# Patient Record
Sex: Female | Born: 1985 | Race: White | Hispanic: No | Marital: Single | Smoking: Never smoker
Health system: Southern US, Community
[De-identification: ages and names within clinical notes are randomized; demographics above are authoritative.]

## PROBLEM LIST (undated history)

## (undated) DIAGNOSIS — F329 Major depressive disorder, single episode, unspecified: Secondary | ICD-10-CM

## (undated) DIAGNOSIS — F32A Depression, unspecified: Secondary | ICD-10-CM

## (undated) DIAGNOSIS — F419 Anxiety disorder, unspecified: Secondary | ICD-10-CM

## (undated) HISTORY — DX: Major depressive disorder, single episode, unspecified: F32.9

## (undated) HISTORY — DX: Depression, unspecified: F32.A

## (undated) HISTORY — PX: NO PAST SURGERIES: SHX2092

## (undated) HISTORY — DX: Anxiety disorder, unspecified: F41.9

---
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
## 2005-01-19 ENCOUNTER — Other Ambulatory Visit: Admission: RE | Admit: 2005-01-19 | Discharge: 2005-01-19 | Payer: Self-pay | Admitting: Internal Medicine

## 2006-10-29 ENCOUNTER — Other Ambulatory Visit: Admission: RE | Admit: 2006-10-29 | Discharge: 2006-10-29 | Payer: Self-pay | Admitting: Internal Medicine

## 2007-11-20 ENCOUNTER — Other Ambulatory Visit: Admission: RE | Admit: 2007-11-20 | Discharge: 2007-11-20 | Payer: Self-pay | Admitting: Internal Medicine

## 2008-11-22 ENCOUNTER — Ambulatory Visit: Payer: Self-pay | Admitting: Internal Medicine

## 2009-06-10 ENCOUNTER — Ambulatory Visit: Payer: Self-pay | Admitting: Internal Medicine

## 2010-04-28 ENCOUNTER — Ambulatory Visit: Payer: Self-pay | Admitting: Internal Medicine

## 2010-04-28 ENCOUNTER — Other Ambulatory Visit
Admission: RE | Admit: 2010-04-28 | Discharge: 2010-04-28 | Payer: Self-pay | Source: Home / Self Care | Admitting: Internal Medicine

## 2010-05-26 ENCOUNTER — Ambulatory Visit: Payer: Self-pay | Admitting: Internal Medicine

## 2010-08-25 ENCOUNTER — Inpatient Hospital Stay (HOSPITAL_COMMUNITY)
Admission: EM | Admit: 2010-08-25 | Discharge: 2010-08-28 | DRG: 450 | Disposition: A | Discharge status: Other Acute Inpatient Hospital | Payer: BC Managed Care – PPO | Attending: Internal Medicine | Admitting: Internal Medicine

## 2010-08-25 DIAGNOSIS — T450X4A Poisoning by antiallergic and antiemetic drugs, undetermined, initial encounter: Secondary | ICD-10-CM

## 2010-08-25 DIAGNOSIS — F3289 Other specified depressive episodes: Secondary | ICD-10-CM | POA: Diagnosis present

## 2010-08-25 DIAGNOSIS — T50992A Poisoning by other drugs, medicaments and biological substances, intentional self-harm, initial encounter: Secondary | ICD-10-CM

## 2010-08-25 DIAGNOSIS — F489 Nonpsychotic mental disorder, unspecified: Secondary | ICD-10-CM

## 2010-08-25 DIAGNOSIS — D72829 Elevated white blood cell count, unspecified: Secondary | ICD-10-CM | POA: Diagnosis present

## 2010-08-25 DIAGNOSIS — F329 Major depressive disorder, single episode, unspecified: Secondary | ICD-10-CM | POA: Diagnosis present

## 2010-08-25 LAB — RAPID URINE DRUG SCREEN, HOSP PERFORMED: Cocaine: NOT DETECTED

## 2010-08-25 LAB — BASIC METABOLIC PANEL
CO2: 20 mEq/L (ref 19–32)
Calcium: 9.6 mg/dL (ref 8.4–10.5)
GFR calc Af Amer: >60 mL/min (ref >60)
Sodium: 139 mEq/L (ref 135–145)

## 2010-08-25 LAB — POCT PREGNANCY, URINE: Preg Test, Ur: NEGATIVE

## 2010-08-25 LAB — CBC
Hemoglobin: 14.4 g/dL (ref 12.0–15.0)
MCH: 31.4 pg (ref 26.0–34.0)
MCV: 92.4 fL (ref 78.0–100.0)
RBC: 4.58 MIL/uL (ref 3.87–5.11)

## 2010-08-25 LAB — URINALYSIS, ROUTINE W REFLEX MICROSCOPIC
Ketones, ur: NEGATIVE mg/dL
Nitrite: NEGATIVE
Protein, ur: NEGATIVE mg/dL
Urobilinogen, UA: 0.2 mg/dL (ref 0.0–1.0)
pH: 7.5 (ref 5.0–8.0)

## 2010-08-25 LAB — MRSA PCR SCREENING: MRSA by PCR: NEGATIVE

## 2010-08-25 LAB — DIFFERENTIAL
Basophils Relative: 0 % (ref 0–1)
Lymphocytes Relative: 17 % (ref 12–46)
Lymphs Abs: 2 10*3/uL (ref 0.7–4.0)
Monocytes Relative: 5 % (ref 3–12)
Neutro Abs: 8.8 10*3/uL — ABNORMAL HIGH (ref 1.7–7.7)
Neutrophils Relative %: 78 % — ABNORMAL HIGH (ref 43–77)

## 2010-08-25 LAB — CK: Total CK: 70 U/L (ref 7–177)

## 2010-08-26 ENCOUNTER — Inpatient Hospital Stay (HOSPITAL_COMMUNITY): Payer: BC Managed Care – PPO

## 2010-08-26 LAB — URINALYSIS, ROUTINE W REFLEX MICROSCOPIC
Leukocytes, UA: NEGATIVE
Nitrite: POSITIVE — AB
Specific Gravity, Urine: 1.017 (ref 1.005–1.030)
pH: 6 (ref 5.0–8.0)

## 2010-08-26 LAB — URINE MICROSCOPIC-ADD ON

## 2010-08-28 ENCOUNTER — Inpatient Hospital Stay (HOSPITAL_COMMUNITY)
Admission: AD | Admit: 2010-08-28 | Discharge: 2010-09-01 | DRG: 426 | Disposition: A | Discharge status: Home / Self Care | Payer: BC Managed Care – PPO | Source: Ambulatory Visit | Attending: Psychiatry | Admitting: Psychiatry

## 2010-08-28 DIAGNOSIS — T50992A Poisoning by other drugs, medicaments and biological substances, intentional self-harm, initial encounter: Secondary | ICD-10-CM

## 2010-08-28 DIAGNOSIS — T450X4A Poisoning by antiallergic and antiemetic drugs, undetermined, initial encounter: Secondary | ICD-10-CM

## 2010-08-28 DIAGNOSIS — F329 Major depressive disorder, single episode, unspecified: Principal | ICD-10-CM

## 2010-08-28 DIAGNOSIS — Z6379 Other stressful life events affecting family and household: Secondary | ICD-10-CM

## 2010-08-28 DIAGNOSIS — F331 Major depressive disorder, recurrent, moderate: Secondary | ICD-10-CM

## 2010-08-28 DIAGNOSIS — F3289 Other specified depressive episodes: Principal | ICD-10-CM

## 2010-08-28 DIAGNOSIS — E876 Hypokalemia: Secondary | ICD-10-CM

## 2010-08-29 DIAGNOSIS — F322 Major depressive disorder, single episode, severe without psychotic features: Secondary | ICD-10-CM

## 2010-08-30 NOTE — Consult Note (Signed)
  NAME:  Lynn Schneider, Lynn Schneider             ACCOUNT NO.:  [id number]  MEDICAL RECORD NO.:  [id number]           PATIENT TYPE:  I  LOCATION:  1503                         FACILITY:  [facility]  PHYSICIAN:  Eulogio Ditch, MD DATE OF BIRTH:  May 31, 1986  DATE OF CONSULTATION: DATE OF DISCHARGE:                                CONSULTATION   HISTORY OF PRESENT ILLNESS:  25 year old white female with a long history of depression, whose depression was worsening for the last few days because of the slow work and number of things "I cannot handle." The patient does not want to discuss in detail about other stressors at this time.  The patient denied any suicide attempt in the past.  The patient took 15 to 20 tablets of Benadryl to kill herself and then called the EMS.  The patient is on therapy for a long period of time, on and off.  She has also tried Zoloft in the past but not for extensive period of time to see the results.  The patient agreed to be startedback on Zoloft.  PAST MEDICAL HISTORY:  No active medical issue.  FAMILY HISTORY:  The patient reported some members in the family has a history of depression.  SUBSTANCE ABUSE HISTORY:  The patient denies abuse of any drugs or alcohol.  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  She is single, has no kids, lives by herself.  MENTAL STATUS EXAM:  Calm, cooperative during the interview, pleasant on approach.  No psychomotor agitation or retardation noted during the interview.  Hygiene, grooming good.  Speech normal in rate, rhythm and volume.  Mood depressed.  Affect, mood congruent.  Thought process logical and goal directed.  Thought content, recent suicide attempt by overdose but currently denies suicidal ideations, not delusional. Thought perception, no audiovisual hallucinations reported, not internally preoccupied.  Cognition, alert, awake, oriented x3.  Memory, immediate, recent, remote intact.  Funds of knowledge  fair.  Attention, concentration fair.  Abstraction ability good.  Insight and judgment poor.  DIAGNOSES:  AXIS I:  Major depressive disorder, recurrent type. AXIS II:  Deferred. AXIS III:  See medical notes. AXIS IV:  Psychosocial stressors, long history of depression. AXIS V:  40.  RECOMMENDATIONS: 1. The patient agreed to be transferred to [facility] for     further stabilization and for group therapy. 2. Patient has agreed to be started on Zoloft 50 mg p.o. daily. 3. Once medically stable, the patient can be transferred to [facility].     Eulogio Ditch, MD     SA/MEDQ  D:  08/28/2010  T:  08/28/2010  Job:  [id number]  Electronically Signed by Eulogio Ditch  on 08/30/2010 04:48:53 AM
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
## 2010-09-04 NOTE — Discharge Summary (Signed)
  Lynn Schneider, Lynn Schneider             ACCOUNT NO.:  [id number]  MEDICAL RECORD NO.:  [id number]           PATIENT TYPE:  I  LOCATION:  0501                          FACILITY:  [facility]  PHYSICIAN:  Marlis Edelson, DO        DATE OF BIRTH:  [birth year]  DATE OF ADMISSION:  08/28/2010 DATE OF DISCHARGE:  09/01/2010                              DISCHARGE SUMMARY   REASON FOR ADMISSION:  This is a 25 year old single white female admitted from the critical care unit after the patient intentionally overdosed on Unisom in a suicide attempt.  The patient did note increasing depression for several weeks prior to the overdose.  She had an uncomplicated medical admission and was at [facility] for further assessment of her depression.  SIGNIFICANT LABORATORY DATA:  MRSA was negative.  Drug screen was negative.  Pregnancy test negative.  Salicylate level less than 4. Acetaminophen level was negative.  FINAL DIAGNOSES:  AXIS I:  Depressive disorder not otherwise specified. AXIS II:  Deferred. AXIS III:  Status post overdose on Unisom. AXIS IV:  Problems with her primary support group. AXIS V:  Current is 60.  SIGNIFICANT FINDINGS:  The patient  was integrated into the adult unit.  She was participating in groups. She was fully alert and pleasant but appeared to be sad.  On initial assessment denied any active suicidal thoughts, was not psychotic.  Her insight was fair to good.  She was expressing a strong desire to get help.  She began to improve having deterrence for self-harm in the future.  Was doing well on Zoloft.  We contacted the patient's sister to review suicide prevention and address any safety issues and concern.  CONDITION UPON DISCHARGE:  The patient was fully alert, had good insight, noting good support that she had, noting positive things about herself.  She was casually but neatly dressed, appropriate grooming. She had good eye contact.  Her speech was normal, clear.  Fully  alert. Affect was appropriate and she was coherent, agreeable to continue with her medications and her follow-up appointments.  She showed no signs of mania, hypomania or overt anxiety.  We felt the patient was stable for discharge.  Her discharge medication was Zoloft 50 mg daily.  Her follow-up was at [facility] on September 23, 2010 at 10 a.m., phone number [phone].  Appointment with Dr. Lenord Fellers on September 27, 2010 at 2:45.     Landry Corporal, N.P.   ______________________________ Marlis Edelson, DO    JO/MEDQ  D:  09/01/2010  T:  09/01/2010  Job:  [id number]  Electronically Signed by Limmie PatriciaP. on 09/04/2010 10:19:30 AM Electronically Signed by Marlis Edelson MD on 09/04/2010 08:10:06 PM

## 2010-09-05 NOTE — Discharge Summary (Signed)
  NAMESARINITY, DICICCO             ACCOUNT NO.:  [id number]  MEDICAL RECORD NO.:  [id number]           PATIENT TYPE:  I  LOCATION:  1503                         FACILITY:  [facility]  PHYSICIAN:  Marinda Elk, M.D.DATE OF BIRTH:  [birth year]  DATE OF ADMISSION:  08/25/2010 DATE OF DISCHARGE:                              DISCHARGE SUMMARY   PRIMARY CARE DOCTOR:  None.  DISCHARGE DIAGNOSES: 1. Suicidal attempt with Benadryl. 2. Leukocytosis probably secondary to suicidal attempt with Benadryl.  DISCHARGE MEDICATIONS:  None.  PROCEDURES:  None.  BRIEF ADMITTING HISTORY AND PHYSICAL:  This is a 25 year old with no significant past medical history.  He took 15-20 tablets of Benadryl with a suicide attempt, then called EMS, was brought here to the ED. When she was here, she was hemodynamically stable.  She was waxing and waning and confused.  So, she CCM was asked to admit and evaluate. Please refer to note from August 25, 2010, for the details from Dr. Sung Amabile.  ASSESSMENT AND PLAN: 1. Diphenhydramine overdose, suicidal attempt.  She was monitored in     the intensive care unit overnight.  She was watched in telemetry     with no events.  Her electrolytes were monitored.  She was found to     be hypokalemic, which was replaced.  She was breathing and sating     well.  By the next day, her confusion had resolved.  She was able     to communicate without any difficulties.  Psych was consulted and     evaluated her, and she is being transferred to [facility]     for further evaluation. 2. Leukocytosis that is probably secondary to diphenhydramine     overdose, suicide attempt.  UA was checked, it was negative.  She     remained afebrile.  So, no further evaluation.  VITALS ON THE DAY OF DISCHARGE:  Temperature is 98.2, heart rate of 73, blood pressure 101/68.  She was sating 95% on room air, breathing 13 times per minute.  Labs on the day of discharge showed, UA, 0-2  white blood cells, few bacteria.     Marinda Elk, M.D.    AF/MEDQ  D:  08/26/2010  T:  08/26/2010  Job:  [id number]  Electronically Signed by Lambert Keto M.D. on 09/05/2010 04:21:18 PM

## 2010-10-05 NOTE — Discharge Summary (Addendum)
  NAMEKATHRYNE, Lynn Schneider             ACCOUNT NO.:  [id number]  MEDICAL RECORD NO.:  [id number]           PATIENT TYPE:  I  LOCATION:  0501                          FACILITY:  [facility]  PHYSICIAN:  Marinda Elk, M.D.DATE OF BIRTH:  03/12/1986  DATE OF ADMISSION:  08/28/2010 DATE OF DISCHARGE:  09/01/2010                              DISCHARGE SUMMARY   ADDENDUM  Nothing has changed.  The patient has remained stable.  She is just awaiting placement.     Marinda Elk, M.D.     AF/MEDQ  D:  10/03/2010  T:  10/04/2010  Job:  [id number]  Electronically Signed by Lambert Keto M.D. on 10/05/2010 08:36:32 AM

## 2010-11-11 ENCOUNTER — Emergency Department (HOSPITAL_COMMUNITY)
Admission: EM | Admit: 2010-11-11 | Discharge: 2010-11-11 | Disposition: A | Discharge status: Home / Self Care | Payer: BC Managed Care – PPO | Attending: Emergency Medicine | Admitting: Emergency Medicine

## 2010-11-11 DIAGNOSIS — Q992 Fragile X chromosome: Secondary | ICD-10-CM | POA: Insufficient documentation

## 2010-11-11 DIAGNOSIS — F411 Generalized anxiety disorder: Secondary | ICD-10-CM | POA: Insufficient documentation

## 2010-11-11 LAB — POCT I-STAT, CHEM 8
Calcium, Ion: 1.13 mmol/L (ref 1.12–1.32)
Hemoglobin: 14.6 g/dL (ref 12.0–15.0)
Sodium: 139 mEq/L (ref 135–145)
TCO2: 23 mmol/L (ref 0–100)

## 2010-11-14 ENCOUNTER — Ambulatory Visit (INDEPENDENT_AMBULATORY_CARE_PROVIDER_SITE_OTHER): Payer: BC Managed Care – PPO | Admitting: Internal Medicine

## 2010-11-14 ENCOUNTER — Encounter: Payer: Self-pay | Admitting: Internal Medicine

## 2010-11-14 ENCOUNTER — Telehealth: Payer: Self-pay | Admitting: Internal Medicine

## 2010-11-14 VITALS — BP 118/74 | HR 76 | Temp 98.9°F | Wt 123.0 lb

## 2010-11-14 DIAGNOSIS — F411 Generalized anxiety disorder: Secondary | ICD-10-CM

## 2010-11-14 DIAGNOSIS — F419 Anxiety disorder, unspecified: Secondary | ICD-10-CM

## 2010-11-14 DIAGNOSIS — N926 Irregular menstruation, unspecified: Secondary | ICD-10-CM

## 2010-11-14 NOTE — Telephone Encounter (Signed)
Pt scheduled for OV today.  

## 2010-11-14 NOTE — Progress Notes (Signed)
  Subjective:    Patient ID: Lynn Schneider, female    DOB: 02/17/1986, 25 y.o.   MRN: [id number]  HPI  Micah Flesher to ER with anxiety on May 4,2012. Treated with Ativan. Sees counselor at [facility]. Had O>D> Feb 2012 hospitalized at [facility] briefly then went to [facility] for about a week. Takes Zoloft 50 mg but not on a regular basis. 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 Prior to that attended [employer] briefly. Having some issues with career path.  Seeing Melton Krebs every Friday for counseling. Not being followed by psychiatrist.  Jeanene Erb me last night with panic attack. Was at step brother's home. Became shaky. Was  Better by the time I returned the call. Mother called today saying she had another panic attack at 1pm at her mother's office . 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 Mother says she is spending lots of money and needs to give up her apartment and come back home to live.   Review of Systems  Psychiatric/Behavioral: Positive for dysphoric mood, decreased concentration and agitation. Negative for hallucinations, behavioral problems and confusion.       Objective:   Physical Exam  Constitutional: She is oriented to person, place, and time.  Neck: No thyromegaly present.  Cardiovascular: Normal rate, regular rhythm and normal heart sounds.   No murmur heard. Neurological: She is alert and oriented to person, place, and time. She has normal reflexes. She displays normal reflexes. No cranial nerve deficit. Coordination normal.  Skin: Skin is warm and dry.  Psychiatric: Her behavior is normal. Judgment and thought content normal.          Assessment & Plan:  Impression- Anxiety disorder               Plan- Add Klonopin 0.05mg - 1/2 to 1 p.o. Bid prn anxiety. Call counselor and obtain psychiatric evaluation

## 2010-11-15 LAB — HCG, SERUM, QUALITATIVE: Preg, Serum: NEGATIVE

## 2010-11-24 ENCOUNTER — Telehealth: Payer: Self-pay

## 2010-11-24 NOTE — Telephone Encounter (Signed)
Called  Dr Cottle's office to schedule her an appt, but office said she had to call herself . Phone no given to patient

## 2010-11-27 NOTE — Telephone Encounter (Signed)
Please call her mother, Hulen Skains and give her this information so MS. Lafayette Dragon can make the appt. Ms. Lafayette Dragon has a chart here with correct phone numbers. You should ask her for Lanecia's correct phone number.

## 2010-12-11 ENCOUNTER — Other Ambulatory Visit: Payer: BC Managed Care – PPO | Admitting: Internal Medicine

## 2010-12-12 ENCOUNTER — Ambulatory Visit (INDEPENDENT_AMBULATORY_CARE_PROVIDER_SITE_OTHER): Payer: BC Managed Care – PPO | Admitting: Internal Medicine

## 2010-12-12 ENCOUNTER — Other Ambulatory Visit (HOSPITAL_COMMUNITY)
Admission: RE | Admit: 2010-12-12 | Discharge: 2010-12-12 | Disposition: A | Discharge status: Home / Self Care | Payer: BC Managed Care – PPO | Source: Ambulatory Visit | Attending: Internal Medicine | Admitting: Internal Medicine

## 2010-12-12 ENCOUNTER — Encounter: Payer: Self-pay | Admitting: Internal Medicine

## 2010-12-12 VITALS — BP 92/68 | HR 72 | Temp 98.8°F | Ht 63.5 in | Wt 120.0 lb

## 2010-12-12 DIAGNOSIS — Z0000 Encounter for general adult medical examination without abnormal findings: Secondary | ICD-10-CM

## 2010-12-12 DIAGNOSIS — N760 Acute vaginitis: Secondary | ICD-10-CM

## 2010-12-12 DIAGNOSIS — F329 Major depressive disorder, single episode, unspecified: Secondary | ICD-10-CM

## 2010-12-12 DIAGNOSIS — Z7251 High risk heterosexual behavior: Secondary | ICD-10-CM

## 2010-12-12 DIAGNOSIS — N390 Urinary tract infection, site not specified: Secondary | ICD-10-CM

## 2010-12-12 DIAGNOSIS — R87810 Cervical high risk human papillomavirus (HPV) DNA test positive: Secondary | ICD-10-CM | POA: Insufficient documentation

## 2010-12-12 DIAGNOSIS — F32A Depression, unspecified: Secondary | ICD-10-CM | POA: Insufficient documentation

## 2010-12-12 LAB — POCT URINALYSIS DIPSTICK
Blood, UA: NEGATIVE
Nitrite, UA: NEGATIVE
Protein, UA: NEGATIVE
pH, UA: 5

## 2010-12-12 MED ORDER — NORGESTIM-ETH ESTRAD TRIPHASIC 0.18/0.215/0.25 MG-25 MCG PO TABS: 1.0000 | ORAL_TABLET | Freq: Every day | ORAL | Status: DC

## 2010-12-12 NOTE — Patient Instructions (Signed)
Increase Zoloft to 100 mg daily. Call me if you have any concerns or problems. Otherwise return in one year for physical examination. We will notify you of your lab results shortly.

## 2010-12-12 NOTE — Progress Notes (Signed)
  Subjective:    Patient ID: Lynn Schneider, female    DOB: [birth year], 25 y.o.   MRN: [id number]  HPI 24 year old white female with recent issues with depression. Was discharged from behavioral health unit February 24 after admission for reported Benadryl overdose. Has been in counseling with Lynn Schneider and has also seen Lynn Schneider. She was told to increase dose of Zoloft from 50-100 mg daily but has yet to do that. She has moved back in with her mother. 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 Currently has no steady boyfriend but is sexually active. Says she is using condoms. Says last menstrual period was about 4 weeks ago. Is supposed to be taking generic Ortho Tri-Cyclen. Feels that her mother tries to tell her what to do. She used to just go along with it but now feels the need to be her own person. Her father died when she was teary young as a result of an MI. Her mother remarried and she was adopted by her mother second husband. For a while she had a good relationship with him but he has since remarried. She feels that sometimes they do not believe her. They were disappointed that she has several tattoos. Several years ago, her mother took her to [facility] to be tested for fragile X. syndrome. She was found to be a carrier. There is a family history of fragile X. in her mothers nephew. Unfortunately, her mother blames fragile X. for many of Lynn Schneider problems. When Lynn Schneider was in grade school and high school, she took attention deficit disorder medication. She has an older sister who is married and has a Development worker, international aid. Lynn Schneider says she has a number of friends. Admits to being sexually active this past weekend. Her mother and  sister are in good health.    Review of Systems denies URI symptoms, vaginal discharge, du jour area, chest pain, shortness of breath, abdominal pain, nausea vomiting or diarrhea. She  had an argument with her mother last night and her eyelids are swollen she says from crying.     Objective:   Physical Exam eyelids swollen bilaterally. no discharge from eyes. Conjunctivae slightly injected. TMs are clear. pharynx is clear. dentition is good. neck is supple. no thyromegaly. no adenopathy. chest clear. breasts normal female without masses. cardiac exam normal without murmurs. Abdomen: no hepatosplenomegaly, masses, or tenderness. pelvic exam: normal external female genitalia. Fishy smelling vaginal discharge cream-colored. GC and Chlamydia probes were taken. Pap smear obtained. No masses on bimanual exam. Rectovaginal confirms. Extremities without deformity. Neurologic: no gross focal deficits o. brief neurological exam.        Assessment & Plan:  1-depression recommended she go up on dose of Zoloft to 100 mg a has recommended by Lynn Schneider. Continue counseling with Lynn Schneider  2-bacterial vaginosis-treat with Cleocin vaginal cream 1 applicator full in vagina each bedtime x7 days followed by a vinegar and water douche for one application only  3-history of being fragile X. carrier

## 2010-12-13 LAB — COMPREHENSIVE METABOLIC PANEL
AST: 14 U/L (ref 0–37)
Albumin: 4.5 g/dL (ref 3.5–5.2)
Alkaline Phosphatase: 47 U/L (ref 39–117)
BUN: 14 mg/dL (ref 6–23)
Creat: 0.62 mg/dL (ref 0.50–1.10)
Glucose, Bld: 74 mg/dL (ref 70–99)
Potassium: 4.2 mEq/L (ref 3.5–5.3)
Total Bilirubin: 0.4 mg/dL (ref 0.3–1.2)

## 2010-12-13 LAB — TSH: TSH: 1.584 u[IU]/mL (ref 0.350–4.500)

## 2010-12-13 LAB — LIPID PANEL
Cholesterol: 157 mg/dL (ref 0–200)
Triglycerides: 63 mg/dL (ref <150)
VLDL: 13 mg/dL (ref 0–40)

## 2010-12-13 LAB — CBC WITH DIFFERENTIAL/PLATELET
Basophils Absolute: 0 10*3/uL (ref 0.0–0.1)
Basophils Relative: 0 % (ref 0–1)
Eosinophils Absolute: 0 10*3/uL (ref 0.0–0.7)
HCT: 42.1 % (ref 36.0–46.0)
MCH: 31.3 pg (ref 26.0–34.0)
MCHC: 33.7 g/dL (ref 30.0–36.0)
Monocytes Absolute: 0.8 10*3/uL (ref 0.1–1.0)
Monocytes Relative: 7 % (ref 3–12)
Neutro Abs: 8.6 10*3/uL — ABNORMAL HIGH (ref 1.7–7.7)
RDW: 12.5 % (ref 11.5–15.5)

## 2010-12-13 LAB — HIV-1 RNA QUANT-NO REFLEX-BLD

## 2010-12-13 LAB — GC/CHLAMYDIA PROBE AMP, GENITAL: GC Probe Amp, Genital: NEGATIVE

## 2010-12-14 LAB — URINE CULTURE: Colony Count: 75000

## 2010-12-14 LAB — HIV ANTIBODY (ROUTINE TESTING W REFLEX): HIV: NONREACTIVE

## 2010-12-14 LAB — HEPATITIS C ANTIBODY: HCV Ab: NEGATIVE

## 2010-12-18 ENCOUNTER — Encounter: Payer: Self-pay | Admitting: Internal Medicine

## 2010-12-19 ENCOUNTER — Encounter: Payer: Self-pay | Admitting: Internal Medicine

## 2010-12-21 ENCOUNTER — Telehealth: Payer: Self-pay | Admitting: *Deleted

## 2010-12-21 DIAGNOSIS — R87619 Unspecified abnormal cytological findings in specimens from cervix uteri: Secondary | ICD-10-CM

## 2010-12-21 DIAGNOSIS — IMO0002 Reserved for concepts with insufficient information to code with codable children: Secondary | ICD-10-CM

## 2010-12-21 NOTE — Telephone Encounter (Signed)
Pt referred to Dr. Renaldo Fiddler for evaluation of abnormal pap smear.  Appointment scheduled Tues 6/19 at 9:45.

## 2011-01-01 ENCOUNTER — Other Ambulatory Visit: Payer: Self-pay | Admitting: Obstetrics and Gynecology

## 2011-06-28 ENCOUNTER — Ambulatory Visit (INDEPENDENT_AMBULATORY_CARE_PROVIDER_SITE_OTHER): Payer: BC Managed Care – PPO | Admitting: Internal Medicine

## 2011-06-28 ENCOUNTER — Encounter: Payer: Self-pay | Admitting: Internal Medicine

## 2011-06-28 DIAGNOSIS — J111 Influenza due to unidentified influenza virus with other respiratory manifestations: Secondary | ICD-10-CM

## 2011-06-28 NOTE — Progress Notes (Signed)
  Subjective:    Patient ID: Lynn Schneider, female    DOB: [birth year], 25 y.o.   MRN: [id number]  HPI  Patient  in today with fever, myalgias, chills, malaise, fatigue, nausea, anorexia. Coughing. Started out with scratchy throat and progressed rapidly into myalgias. Did not take influenza immunization this season. Vomited several times last night. No diarrhea.    Review of Systems     Objective:   Physical Exam HEENT exam: TMs are pink and full bilaterally; pharynx very slightly injected; neck is supple without adenopathy; chest clear. Patient looks a bit pale.        Assessment & Plan:  Influenza  Plan: Tamiflu 75 mg twice daily for 5 days; Tessalon Perles 100 mg (#60) 2 by mouth 3 times a day when necessary cough; Zofran 8 mg tablets (#30) 1 by mouth every 6-8 hours when necessary nausea. Call if not better in one week or sooner if  worse. Take Tylenol for fever.

## 2011-06-28 NOTE — Patient Instructions (Signed)
Take Tylenol for fever. Taking Zofran for nausea. Start Tamiflu immediately 75 mg twice daily for 5 days. Take Tessalon Perles as needed for cough. Call if not better in one week or sooner if worse.

## 2012-04-24 ENCOUNTER — Other Ambulatory Visit: Payer: BC Managed Care – PPO | Admitting: Internal Medicine

## 2012-04-24 DIAGNOSIS — F32A Depression, unspecified: Secondary | ICD-10-CM

## 2012-04-24 DIAGNOSIS — F329 Major depressive disorder, single episode, unspecified: Secondary | ICD-10-CM

## 2012-04-24 DIAGNOSIS — Z0000 Encounter for general adult medical examination without abnormal findings: Secondary | ICD-10-CM

## 2012-04-24 LAB — CBC WITH DIFFERENTIAL/PLATELET
Basophils Absolute: 0 10*3/uL (ref 0.0–0.1)
Basophils Relative: 1 % (ref 0–1)
HCT: 42.2 % (ref 36.0–46.0)
Lymphocytes Relative: 31 % (ref 12–46)
MCHC: 35.3 g/dL (ref 30.0–36.0)
Monocytes Absolute: 0.5 10*3/uL (ref 0.1–1.0)
Neutro Abs: 4.5 10*3/uL (ref 1.7–7.7)
Platelets: 278 10*3/uL (ref 150–400)
RDW: 12.9 % (ref 11.5–15.5)
WBC: 7.4 10*3/uL (ref 4.0–10.5)

## 2012-04-24 LAB — LIPID PANEL
Cholesterol: 192 mg/dL (ref 0–200)
LDL Cholesterol: 129 mg/dL — ABNORMAL HIGH (ref 0–99)
Total CHOL/HDL Ratio: 4.5 Ratio
Triglycerides: 98 mg/dL (ref <150)
VLDL: 20 mg/dL (ref 0–40)

## 2012-04-24 LAB — COMPREHENSIVE METABOLIC PANEL
ALT: 9 U/L (ref 0–35)
AST: 12 U/L (ref 0–37)
Albumin: 4.1 g/dL (ref 3.5–5.2)
Alkaline Phosphatase: 50 U/L (ref 39–117)
BUN: 14 mg/dL (ref 6–23)
Creat: 0.65 mg/dL (ref 0.50–1.10)
Potassium: 4.2 mEq/L (ref 3.5–5.3)

## 2012-04-25 ENCOUNTER — Ambulatory Visit (INDEPENDENT_AMBULATORY_CARE_PROVIDER_SITE_OTHER): Payer: BC Managed Care – PPO | Admitting: Internal Medicine

## 2012-04-25 ENCOUNTER — Encounter: Payer: Self-pay | Admitting: Internal Medicine

## 2012-04-25 VITALS — BP 100/66 | HR 72 | Temp 99.0°F | Ht 63.5 in | Wt 133.0 lb

## 2012-04-25 DIAGNOSIS — Z0000 Encounter for general adult medical examination without abnormal findings: Secondary | ICD-10-CM

## 2012-04-25 DIAGNOSIS — Z8659 Personal history of other mental and behavioral disorders: Secondary | ICD-10-CM

## 2012-04-25 LAB — POCT URINALYSIS DIPSTICK
Bilirubin, UA: NEGATIVE
Blood, UA: NEGATIVE
Ketones, UA: NEGATIVE
Protein, UA: NEGATIVE
pH, UA: 7

## 2012-04-25 NOTE — Progress Notes (Signed)
Subjective:    Patient ID: Lynn Schneider, female    DOB: May 22, 1986, 26 y.o.   MRN: [id number]  HPI 26 year old white female with history of depression. She was hospitalized February 2012 for Unisom overdose and was transferred to [facility].  [facility] when she was 26 years old. Had previously been diagnosed by Dr. Jane Canary with attention deficit disorder. Was on Ritalin for a number of years. Subsequently was on Concerta. Currently not on any attention deficit medication through this office. Her biological father died when she was approximately 26 years old of an acute heart attack. He was 26 years old and previously had been incarcerated for selling drugs for some 10 years. Her mother had remarried and her new husband adopted [place] but not her older sister. Mother is a self-employed Network engineer. Mother has since divorced and is single again. Maternal female cousin has fragile X syndrome. A maternal uncle has schizophrenia diagnosed at age 26. Mother is in good health.  Received meningococcal vaccine August 2003 before going to preparatory school in [place]. 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 Tetanus immunization April 28, 2010.  Apparently Yorley has been tested at [facility] and has an X  chromosome  for [employer]. We do not have reports from [facility]. She is always struggled in school. She attended [employer] for short time and did not do well. She lived on campus. She is now  living with her mother. She says she wants to move to [facility]. 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 Thinks she might would like to do skin care in [place].  Recently saw Dr. Zelphia Cairo for GYN exam. Says she's on oral contraceptives. Denies being sexually active at present time.         Review of Systems  Constitutional: Negative.   HENT:  Negative.   Eyes: Negative.   Respiratory: Negative.   Genitourinary: Negative.   Musculoskeletal: Negative.   Neurological: Negative.   Hematological: Negative.   Psychiatric/Behavioral:       History of depression. History of attention deficit       Objective:   Physical Exam  Vitals reviewed. Constitutional: She is oriented to person, place, and time. She appears well-developed and well-nourished. No distress.  HENT:  Head: Normocephalic and atraumatic.  Right Ear: External ear normal.  Left Ear: External ear normal.  Mouth/Throat: Oropharynx is clear and moist. No oropharyngeal exudate.  Eyes: Conjunctivae normal and EOM are normal. Pupils are equal, round, and reactive to light. Right eye exhibits no discharge. Left eye exhibits no discharge. No scleral icterus.  Neck: Neck supple. No JVD present. No thyromegaly present.  Cardiovascular: Normal rate, regular rhythm, normal heart sounds and intact distal pulses.   No murmur heard. Pulmonary/Chest: Effort normal and breath sounds normal. No respiratory distress. She has no wheezes. She has no rales. She exhibits no tenderness.       Breasts normal female without masses  Abdominal: Soft. Bowel sounds are normal. She exhibits no distension and no mass. There is no tenderness. There is no rebound and no guarding.  Genitourinary:       Deferred to GYN  Musculoskeletal: She exhibits no edema.  Lymphadenopathy:    She has no cervical adenopathy.  Neurological: She is alert and oriented to person, place, and time. She has normal reflexes. She displays normal reflexes.  No cranial nerve deficit. Coordination normal.  Skin: Skin is warm and dry. No rash noted. She is not diaphoretic.       Multiple tattoos. One tattoo on each wrist-single words in Spanish.  Psychiatric: She has a normal mood and affect. Her behavior is normal. Judgment and thought content normal.          Assessment & Plan:  Normal health maintenance  exam  History of depression  History of X chromosome for fragile X syndrome  History of attention deficit disorder  Plan: Return in one year or as needed. Get records from GYN physician. We need to determine if she has had Gardasil vaccine. I do not have any records of having given her Gardasil vaccine through this office.

## 2012-04-26 ENCOUNTER — Encounter: Payer: Self-pay | Admitting: Internal Medicine

## 2012-04-26 NOTE — Patient Instructions (Addendum)
Continue same medications and return in one year. 

## 2012-11-06 ENCOUNTER — Encounter: Payer: Self-pay | Admitting: Internal Medicine

## 2012-11-06 ENCOUNTER — Ambulatory Visit (INDEPENDENT_AMBULATORY_CARE_PROVIDER_SITE_OTHER): Payer: BC Managed Care – PPO | Admitting: Internal Medicine

## 2012-11-06 VITALS — BP 92/66 | Temp 100.7°F | Wt 136.5 lb

## 2012-11-06 DIAGNOSIS — R509 Fever, unspecified: Secondary | ICD-10-CM

## 2012-11-06 DIAGNOSIS — N10 Acute tubulo-interstitial nephritis: Secondary | ICD-10-CM

## 2012-11-06 DIAGNOSIS — N390 Urinary tract infection, site not specified: Secondary | ICD-10-CM

## 2012-11-06 DIAGNOSIS — Z148 Genetic carrier of other disease: Secondary | ICD-10-CM | POA: Insufficient documentation

## 2012-11-06 LAB — COMPREHENSIVE METABOLIC PANEL
ALT: 15 U/L (ref 0–35)
AST: 13 U/L (ref 0–37)
CO2: 24 mEq/L (ref 19–32)
Chloride: 104 mEq/L (ref 96–112)
Sodium: 138 mEq/L (ref 135–145)
Total Bilirubin: 0.5 mg/dL (ref 0.3–1.2)
Total Protein: 7 g/dL (ref 6.0–8.3)

## 2012-11-06 LAB — CBC WITH DIFFERENTIAL/PLATELET
HCT: 41.3 % (ref 36.0–46.0)
Lymphs Abs: 2.3 10*3/uL (ref 0.7–4.0)
MCH: 30.5 pg (ref 26.0–34.0)
MCV: 94 fL (ref 78.0–100.0)
Monocytes Absolute: 0.9 10*3/uL (ref 0.1–1.0)
Platelets: 250 10*3/uL (ref 150–400)
RDW: 12.2 % (ref 11.5–15.5)

## 2012-11-06 MED ORDER — CEFTRIAXONE SODIUM 1 G IJ SOLR
1.0000 g | Freq: Once | INTRAMUSCULAR | Status: AC
  Administered 2012-11-06: 1 g via INTRAMUSCULAR

## 2012-11-06 NOTE — Patient Instructions (Addendum)
You have been given 1 g IM Rocephin. Blood work is pending. Take Cipro 500 mg twice daily for 10 days. Urine culture has been sent. Return in 2 weeks.

## 2012-11-06 NOTE — Progress Notes (Signed)
  Subjective:    Patient ID: Lynn Schneider, female    DOB: August 07, 1985, 27 y.o.   MRN: [id number]  HPI  Onset 2 days ago of fever and shaking chills. Has had myalgias. No frank back pain. Slight nausea. Vomited 2 days ago and yesterday but not today. Denies urinary tract infection symptoms. Last menstrual period was last week. Temperature is been up to 104 according to patient. Denies possibility of pregnancy. 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 Denies vaginal discharge. No sore throat or cough.    Review of Systems     Objective:   Physical Exam slight bilateral CVA tenderness; chest clear to auscultation; TMs are slightly full bilaterally but not red. Pharynx is clear. Neck is supple without adenopathy. Urinalysis is abnormal with LE and nitrite present. Culture sent.        Assessment & Plan:  Probable pyelonephritis  Plan: CBC with differential and C-met sent. 1 g IM Rocephin. Start Cipro 500 mg twice daily for 10 days. Return in 2 weeks.

## 2012-11-07 LAB — POCT URINALYSIS DIPSTICK
Bilirubin, UA: NEGATIVE
Glucose, UA: NEGATIVE
Ketones, UA: NEGATIVE
Spec Grav, UA: 1.02
pH, UA: 6

## 2012-11-10 LAB — URINE CULTURE: Colony Count: >=100000

## 2012-12-18 ENCOUNTER — Telehealth: Payer: Self-pay

## 2012-12-18 ENCOUNTER — Ambulatory Visit (INDEPENDENT_AMBULATORY_CARE_PROVIDER_SITE_OTHER): Payer: BC Managed Care – PPO | Admitting: Internal Medicine

## 2012-12-18 ENCOUNTER — Encounter: Payer: Self-pay | Admitting: Internal Medicine

## 2012-12-18 VITALS — BP 96/60 | HR 88 | Temp 98.4°F | Wt 138.0 lb

## 2012-12-18 DIAGNOSIS — B999 Unspecified infectious disease: Secondary | ICD-10-CM

## 2012-12-18 MED ORDER — CEFTRIAXONE SODIUM 1 G IJ SOLR
1.0000 g | Freq: Once | INTRAMUSCULAR | Status: AC
  Administered 2012-12-18: 1 g via INTRAMUSCULAR

## 2012-12-18 NOTE — Telephone Encounter (Signed)
Called pharmacy and authorized per Dr. Lenord Fellers Levaquin 500mg ; #7; take 1 tablet daily for infection; no refills. KW

## 2013-01-05 ENCOUNTER — Encounter: Payer: Self-pay | Admitting: Internal Medicine

## 2013-01-05 NOTE — Patient Instructions (Addendum)
Take antibiotics as directed. Call if not better in 48 hours or sooner if worse

## 2013-01-05 NOTE — Progress Notes (Signed)
  Subjective:    Patient ID: Lynn Schneider, female    DOB: 1986/04/10, 27 y.o.   MRN: [id number]  HPI patient presents with erythema and tenderness base of right third finger. Denies any injury to that area.    Review of Systems     Objective:   Physical Exam erythema without swelling the 20 right third MCP and PIP joints. Full range of motion right finger       Assessment & Plan:  Suspect paronychia right finger with cellulitis  Plan: Keflex 500 mg 4 times daily for 7 days.

## 2013-04-28 ENCOUNTER — Other Ambulatory Visit: Payer: BC Managed Care – PPO | Admitting: Internal Medicine

## 2013-04-30 ENCOUNTER — Encounter: Payer: BC Managed Care – PPO | Admitting: Internal Medicine

## 2013-06-01 ENCOUNTER — Other Ambulatory Visit: Payer: BC Managed Care – PPO | Admitting: Internal Medicine

## 2013-06-02 ENCOUNTER — Encounter: Payer: BC Managed Care – PPO | Admitting: Internal Medicine

## 2013-06-25 ENCOUNTER — Other Ambulatory Visit: Payer: BC Managed Care – PPO | Admitting: Internal Medicine

## 2013-06-25 DIAGNOSIS — Z130 Encounter for screening for diseases of the blood and blood-forming organs and certain disorders involving the immune mechanism: Secondary | ICD-10-CM

## 2013-06-25 DIAGNOSIS — Z1329 Encounter for screening for other suspected endocrine disorder: Secondary | ICD-10-CM

## 2013-06-25 DIAGNOSIS — Z0000 Encounter for general adult medical examination without abnormal findings: Secondary | ICD-10-CM

## 2013-06-25 DIAGNOSIS — Z13220 Encounter for screening for lipoid disorders: Secondary | ICD-10-CM

## 2013-06-25 LAB — CBC WITH DIFFERENTIAL/PLATELET
Basophils Absolute: 0 10*3/uL (ref 0.0–0.1)
Eosinophils Absolute: 0.1 10*3/uL (ref 0.0–0.7)
Eosinophils Relative: 1 % (ref 0–5)
Lymphs Abs: 2.1 10*3/uL (ref 0.7–4.0)
MCH: 30.7 pg (ref 26.0–34.0)
MCV: 88.7 fL (ref 78.0–100.0)
Monocytes Absolute: 0.6 10*3/uL (ref 0.1–1.0)
Platelets: 276 10*3/uL (ref 150–400)
RDW: 13.4 % (ref 11.5–15.5)

## 2013-06-25 LAB — COMPREHENSIVE METABOLIC PANEL
BUN: 18 mg/dL (ref 6–23)
CO2: 27 mEq/L (ref 19–32)
Calcium: 9.2 mg/dL (ref 8.4–10.5)
Chloride: 107 mEq/L (ref 96–112)
Creat: 0.6 mg/dL (ref 0.50–1.10)
Glucose, Bld: 93 mg/dL (ref 70–99)

## 2013-06-25 LAB — LIPID PANEL
Cholesterol: 207 mg/dL — ABNORMAL HIGH (ref 0–200)
HDL: 46 mg/dL (ref >39)
Total CHOL/HDL Ratio: 4.5 Ratio
Triglycerides: 85 mg/dL (ref <150)

## 2013-06-26 ENCOUNTER — Ambulatory Visit (INDEPENDENT_AMBULATORY_CARE_PROVIDER_SITE_OTHER): Payer: BC Managed Care – PPO | Admitting: Internal Medicine

## 2013-06-26 ENCOUNTER — Other Ambulatory Visit (HOSPITAL_COMMUNITY)
Admission: RE | Admit: 2013-06-26 | Discharge: 2013-06-26 | Disposition: A | Discharge status: Home / Self Care | Payer: BC Managed Care – PPO | Source: Ambulatory Visit | Attending: Internal Medicine | Admitting: Internal Medicine

## 2013-06-26 ENCOUNTER — Encounter: Payer: Self-pay | Admitting: Internal Medicine

## 2013-06-26 VITALS — BP 100/74 | HR 68 | Temp 95.9°F | Resp 18 | Ht 63.0 in | Wt 136.0 lb

## 2013-06-26 DIAGNOSIS — Z0000 Encounter for general adult medical examination without abnormal findings: Secondary | ICD-10-CM

## 2013-06-26 DIAGNOSIS — Z01419 Encounter for gynecological examination (general) (routine) without abnormal findings: Secondary | ICD-10-CM | POA: Insufficient documentation

## 2013-06-26 DIAGNOSIS — Z113 Encounter for screening for infections with a predominantly sexual mode of transmission: Secondary | ICD-10-CM

## 2013-06-26 DIAGNOSIS — Z23 Encounter for immunization: Secondary | ICD-10-CM

## 2013-06-27 LAB — GC/CHLAMYDIA PROBE AMP: GC Probe RNA: NEGATIVE

## 2013-08-04 ENCOUNTER — Encounter: Payer: Self-pay | Admitting: Internal Medicine

## 2013-08-04 ENCOUNTER — Telehealth: Payer: Self-pay

## 2013-08-04 ENCOUNTER — Ambulatory Visit (INDEPENDENT_AMBULATORY_CARE_PROVIDER_SITE_OTHER): Payer: BC Managed Care – PPO | Admitting: Internal Medicine

## 2013-08-04 VITALS — BP 96/70 | Temp 98.6°F | Wt 135.5 lb

## 2013-08-04 DIAGNOSIS — R059 Cough, unspecified: Secondary | ICD-10-CM

## 2013-08-04 DIAGNOSIS — H6500 Acute serous otitis media, unspecified ear: Secondary | ICD-10-CM

## 2013-08-04 DIAGNOSIS — R05 Cough: Secondary | ICD-10-CM

## 2013-08-04 DIAGNOSIS — H6502 Acute serous otitis media, left ear: Secondary | ICD-10-CM

## 2013-08-04 MED ORDER — BENZONATATE 100 MG PO CAPS: 200.0000 mg | ORAL_CAPSULE | Freq: Three times a day (TID) | ORAL | Status: DC

## 2013-08-04 MED ORDER — AZITHROMYCIN 250 MG PO TABS: ORAL_TABLET | ORAL | Status: DC

## 2013-08-04 NOTE — Telephone Encounter (Signed)
States she's had a cough for about 2 weeks now. Started with a scratchy throat which is now gone. No fever, chills. Cough is non productive. No congestion or earache. Has used OTC cough preparations.

## 2013-08-04 NOTE — Telephone Encounter (Signed)
Left message on phone for patient to come in at 4:15 today.

## 2013-08-04 NOTE — Telephone Encounter (Signed)
See today

## 2013-08-05 NOTE — Patient Instructions (Addendum)
Take Zithromax Z-PAK as directed. Take Tessalon Perles as needed for cough. Call if not better in 7-10 days 

## 2013-08-05 NOTE — Progress Notes (Signed)
   Subjective:    Patient ID: Lynn Schneider, female    DOB: 12/24/1985, 28 y.o.   MRN: [id number]  HPI patient's mother recently had influenza type illness. Patient has developed respiratory congestion and cough. Cough simply will not go away. Cough has been present for over a week. Cough is described as dry. No fever or shaking chills. No sore throat.    Review of Systems     Objective:   Physical Exam Left TM is full but not red. Right TM clear. Pharynx is clear. Neck is supple. Chest clear.       Assessment & Plan:  Left serous otitis media  Cough  Plan: Zithromax Z-Pak take 2 tablets day one followed by 1 tablet days 2 through 5. Tessalon Perles 200 mg 3 times a day when necessary cough

## 2013-12-06 NOTE — Progress Notes (Signed)
Subjective:    Patient ID: Lynn Schneider, female    DOB: 07/25/1985, 28 y.o.   MRN: [id number]  HPI 28 year old white female in today for health maintenance exam. She has history of depression. 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    She was hospitalized Feb 2012 for a Unisom overdose and was [facility].  [facility] when she was [age] years old. Had previously been diagnosed by Dr. Jane CanarySharpless with attention deficit disorder. She was on Ritalin for a number of years. Subsequently was on Concerta.  Social history and Family History: She is single, never married. Lives with her mother who is an Network engineerinterior designer. Her biological father died when she was approximately 28 years old of an acute heart attack. He had previously been incarcerated for selling drugs for some 10 years. Her mother remarried and her new husband adopted Lynn Schneider but not her older sister. Mother has since divorced and is single again. Maternal female cousin has fragile X. syndrome. A maternal uncle has schizophrenia diagnosed at age 28. Mother is in good health.  Receive meningococcal vaccine in August 2003 before going to preparatory school in [place]. She also had a PPD at that time. Had tetanus immunization 04/28/2010.  Has GYN physician and is on oral contraceptives.  Lynn Schneider was tested at [facility] and has been told she has  chromosome for fragile X. syndrome. We do not have that report. She has always struggled in school. She attended [employer] for short time and did not do well. She lived on campus.    Review of Systems  Constitutional: Negative.   HENT: Negative.   Eyes: Negative.   Respiratory: Negative.   Cardiovascular: Negative.   Gastrointestinal: Negative.   Endocrine:  Negative.   Allergic/Immunologic: Negative.   Neurological: Negative.   Psychiatric/Behavioral:       History of depression and attention deficit issues       Objective:   Physical Exam  Vitals reviewed. Constitutional: She is oriented to person, place, and time. She appears well-developed and well-nourished. No distress.  HENT:  Head: Normocephalic and atraumatic.  Right Ear: External ear normal.  Left Ear: External ear normal.  Mouth/Throat: Oropharynx is clear and moist. No oropharyngeal exudate.  Eyes: Conjunctivae and EOM are normal. Pupils are equal, round, and reactive to light. Right eye exhibits no discharge. Left eye exhibits no discharge. No scleral icterus.  Neck: Neck supple. No JVD present. No thyromegaly present.  Cardiovascular: Normal rate, regular rhythm, normal heart sounds and intact distal pulses.   No murmur heard. Pulmonary/Chest: Effort normal and breath sounds normal. No respiratory distress. She has no wheezes. She has no rales. She exhibits no tenderness.  Breasts normal female  Abdominal: Soft. Bowel sounds are normal. She exhibits no distension and no mass. There is no tenderness. There is no rebound and no guarding.  Genitourinary:  Pap taken. GC and Chlamydia probes taken  Musculoskeletal: Normal range of motion. She exhibits no edema.  Lymphadenopathy:    She has no cervical adenopathy.  Neurological: She is alert and oriented to person, place, and time. She has normal reflexes. She displays normal reflexes. No cranial nerve deficit. Coordination normal.  Skin: Skin is warm and dry. She is not diaphoretic.  Multiple tattoos  Psychiatric: She has a normal mood  and affect. Her behavior is normal. Judgment and thought content normal.          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

## 2013-12-07 ENCOUNTER — Encounter: Payer: Self-pay | Admitting: Internal Medicine

## 2013-12-07 NOTE — Patient Instructions (Signed)
Return in one year.

## 2014-03-26 DIAGNOSIS — Z0289 Encounter for other administrative examinations: Secondary | ICD-10-CM

## 2014-04-28 ENCOUNTER — Ambulatory Visit (INDEPENDENT_AMBULATORY_CARE_PROVIDER_SITE_OTHER): Payer: BC Managed Care – PPO | Admitting: Internal Medicine

## 2014-04-28 DIAGNOSIS — Z23 Encounter for immunization: Secondary | ICD-10-CM

## 2014-07-05 ENCOUNTER — Other Ambulatory Visit: Payer: BC Managed Care – PPO | Admitting: Internal Medicine

## 2014-07-05 DIAGNOSIS — Z1329 Encounter for screening for other suspected endocrine disorder: Secondary | ICD-10-CM

## 2014-07-05 DIAGNOSIS — Z13220 Encounter for screening for lipoid disorders: Secondary | ICD-10-CM

## 2014-07-05 DIAGNOSIS — Z130 Encounter for screening for diseases of the blood and blood-forming organs and certain disorders involving the immune mechanism: Secondary | ICD-10-CM

## 2014-07-05 DIAGNOSIS — Z0000 Encounter for general adult medical examination without abnormal findings: Secondary | ICD-10-CM

## 2014-07-05 DIAGNOSIS — Z1321 Encounter for screening for nutritional disorder: Secondary | ICD-10-CM

## 2014-07-05 LAB — LIPID PANEL
CHOL/HDL RATIO: 4.7 ratio
Cholesterol: 173 mg/dL (ref 0–200)
HDL: 37 mg/dL — AB (ref >39)
LDL Cholesterol: 117 mg/dL — ABNORMAL HIGH (ref 0–99)
Triglycerides: 97 mg/dL (ref <150)
VLDL: 19 mg/dL (ref 0–40)

## 2014-07-05 LAB — CBC WITH DIFFERENTIAL/PLATELET
BASOS ABS: 0 10*3/uL (ref 0.0–0.1)
Basophils Relative: 0 % (ref 0–1)
Eosinophils Absolute: 0.1 10*3/uL (ref 0.0–0.7)
Eosinophils Relative: 1 % (ref 0–5)
HEMATOCRIT: 40.6 % (ref 36.0–46.0)
Hemoglobin: 13.8 g/dL (ref 12.0–15.0)
Lymphocytes Relative: 26 % (ref 12–46)
Lymphs Abs: 2.1 10*3/uL (ref 0.7–4.0)
MCH: 30.9 pg (ref 26.0–34.0)
MCHC: 34 g/dL (ref 30.0–36.0)
MCV: 91 fL (ref 78.0–100.0)
MONOS PCT: 6 % (ref 3–12)
MPV: 9.3 fL — ABNORMAL LOW (ref 9.4–12.4)
Monocytes Absolute: 0.5 10*3/uL (ref 0.1–1.0)
Neutro Abs: 5.4 10*3/uL (ref 1.7–7.7)
Neutrophils Relative %: 67 % (ref 43–77)
Platelets: 251 10*3/uL (ref 150–400)
RBC: 4.46 MIL/uL (ref 3.87–5.11)
RDW: 13 % (ref 11.5–15.5)
WBC: 8 10*3/uL (ref 4.0–10.5)

## 2014-07-05 LAB — COMPREHENSIVE METABOLIC PANEL
ALT: 9 U/L (ref 0–35)
AST: 11 U/L (ref 0–37)
Albumin: 4.1 g/dL (ref 3.5–5.2)
Alkaline Phosphatase: 50 U/L (ref 39–117)
BUN: 12 mg/dL (ref 6–23)
CALCIUM: 9 mg/dL (ref 8.4–10.5)
CHLORIDE: 109 meq/L (ref 96–112)
CO2: 24 meq/L (ref 19–32)
CREATININE: 0.55 mg/dL (ref 0.50–1.10)
Glucose, Bld: 91 mg/dL (ref 70–99)
Potassium: 4.1 mEq/L (ref 3.5–5.3)
SODIUM: 138 meq/L (ref 135–145)
TOTAL PROTEIN: 6.4 g/dL (ref 6.0–8.3)
Total Bilirubin: 0.5 mg/dL (ref 0.2–1.2)

## 2014-07-06 ENCOUNTER — Ambulatory Visit (INDEPENDENT_AMBULATORY_CARE_PROVIDER_SITE_OTHER): Payer: BC Managed Care – PPO | Admitting: Internal Medicine

## 2014-07-06 ENCOUNTER — Encounter: Payer: Self-pay | Admitting: Internal Medicine

## 2014-07-06 ENCOUNTER — Telehealth: Payer: Self-pay | Admitting: Internal Medicine

## 2014-07-06 DIAGNOSIS — Z0000 Encounter for general adult medical examination without abnormal findings: Secondary | ICD-10-CM

## 2014-07-06 LAB — TSH: TSH: 1.525 u[IU]/mL (ref 0.350–4.500)

## 2014-07-06 LAB — VITAMIN D 25 HYDROXY (VIT D DEFICIENCY, FRACTURES): Vit D, 25-Hydroxy: 27 ng/mL — ABNORMAL LOW (ref 30–100)

## 2014-07-06 NOTE — Telephone Encounter (Signed)
Patient was scheduled for yearly exam today, 07/06/14.  Patient had CPE labs drawn on Monday, 12/28 and confirmed appointment and then was a no show today.   LMOM for patient to please call back to R/S.  Also advised of our no show policy and reminded patient to please call us in the future to cancel appointments to avoid this $50 no show fee.

## 2014-07-06 NOTE — Progress Notes (Signed)
   Subjective:    Patient ID: Lynn Schneider, female    DOB: 09/03/1985, 28 y.o.   MRN: [id number]  HPI  Did not keep appt for CPE today. Pt to be called. Remind re: missed appt policy.    Review of Systems     Objective:   Physical Exam        Assessment & Plan:

## 2014-07-06 NOTE — Telephone Encounter (Signed)
Message left on cell phone re missed CPE appt today. MJB

## 2014-07-06 NOTE — Patient Instructions (Signed)
Needs to reschedule missed appt

## 2014-07-08 ENCOUNTER — Telehealth: Payer: Self-pay | Admitting: Internal Medicine

## 2014-07-08 NOTE — Telephone Encounter (Signed)
Lynn Schneider worried about Lynn Schneider. She called in sick for couple of days this week. She is working for her Lynn Schneider. Living with a roommate who has a small baby. Attempted again to call her to see if she is okay. Cell phone went to voicemail. Did not leave a message today. Had left a message a few days ago about missed appointment. We'll continue to try to call her.

## 2014-07-10 ENCOUNTER — Telehealth: Payer: Self-pay | Admitting: Internal Medicine

## 2014-07-10 NOTE — Telephone Encounter (Signed)
Pt not answering phone. Left message that I was concerned about her and would like to reschedule appt. Mother says she has not been answering her phone lately.

## 2014-11-09 ENCOUNTER — Encounter: Payer: Self-pay | Admitting: Internal Medicine

## 2014-11-09 ENCOUNTER — Ambulatory Visit (INDEPENDENT_AMBULATORY_CARE_PROVIDER_SITE_OTHER): Payer: 59 | Admitting: Internal Medicine

## 2014-11-09 VITALS — BP 102/68 | HR 78 | Temp 98.0°F | Wt 148.0 lb

## 2014-11-09 DIAGNOSIS — H6503 Acute serous otitis media, bilateral: Secondary | ICD-10-CM | POA: Diagnosis not present

## 2014-11-09 DIAGNOSIS — J069 Acute upper respiratory infection, unspecified: Secondary | ICD-10-CM | POA: Diagnosis not present

## 2014-11-09 MED ORDER — AZITHROMYCIN 250 MG PO TABS: ORAL_TABLET | ORAL | Status: DC

## 2014-11-09 NOTE — Patient Instructions (Signed)
Take Zithromax to by mouth day one followed by 1 by mouth days 2 through 5. Takes Sudafed PE once or twice daily. Call if not better in 7 days.

## 2014-11-09 NOTE — Progress Notes (Signed)
   Subjective:    Patient ID: Lynn Schneider, female    DOB: 01/02/1986, 29 y.o.   MRN: [id number]  HPI  Patient has had acute URI symptoms for couple of weeks. Has some ear pain tickly left ear. Has had some sore throat and some maxillary sinus tenderness. No fever or shaking chills.    Review of Systems     Objective:   Physical Exam  Skin warm and dry. Nodes none. TMs are full bilaterally but not red. Pharynx slightly injected without exudate. Neck is supple without adenopathy. Chest clear to auscultation. She sounds nasally congested.      Assessment & Plan:  Acute URI  Acute bilateral serous otitis media  Plan: Recommend over-the-counter Sudafed PE once or twice daily. Zithromax Z-PAK 2 tablets by mouth day one followed by 1 tablet by mouth days 2 through 5. Call if not better in 7 days or sooner if worse.

## 2015-04-22 ENCOUNTER — Ambulatory Visit (INDEPENDENT_AMBULATORY_CARE_PROVIDER_SITE_OTHER): Payer: 59 | Admitting: Internal Medicine

## 2015-04-22 ENCOUNTER — Encounter: Payer: Self-pay | Admitting: Internal Medicine

## 2015-04-22 VITALS — BP 110/72 | HR 84 | Temp 97.9°F | Wt 152.0 lb

## 2015-04-22 DIAGNOSIS — H6691 Otitis media, unspecified, right ear: Secondary | ICD-10-CM

## 2015-04-22 DIAGNOSIS — H00014 Hordeolum externum left upper eyelid: Secondary | ICD-10-CM | POA: Diagnosis not present

## 2015-04-22 DIAGNOSIS — Z23 Encounter for immunization: Secondary | ICD-10-CM

## 2015-04-22 MED ORDER — OFLOXACIN 0.3 % OP SOLN: OPHTHALMIC | Status: DC

## 2015-04-22 MED ORDER — DOXYCYCLINE HYCLATE 100 MG PO TABS: 100.0000 mg | ORAL_TABLET | Freq: Two times a day (BID) | ORAL | Status: DC

## 2015-04-22 NOTE — Progress Notes (Signed)
   Subjective:    Patient ID: Lynn Schneider, female    DOB: 06/01/1986, 29 y.o.   MRN: [id number]  HPI Patient had onset of eye irritation Monday, October 10. The next day she had a stye on left upper eyelid. She has tried some warm hot compresses but it has not improved. Also feels that she's coming down with a respiratory infection. Has scratchy throat and some ear pain.    Review of Systems     Objective:   Physical Exam  Hordeolum with erythematous left upper eyelid. No drainage from. She has a right otitis media with right TM dull and retracted. It is not red. Left TM is chronically scarred. Pharynx is slightly injected. Neck is supple. Chest clear to auscultation.      Assessment & Plan:  Hordeolum left upper eyelid  Right otitis media  Plan: Doxycycline 100 mg twice daily for 7 days. Ofloxacin ophthalmic drops 2 drops in left eye 4 times a day for 5 days. If not better in one week, refer to ophthalmologist. Flu vaccine given today. Warm hot compresses to left eye 4 times daily for 15 minutes.

## 2015-04-22 NOTE — Patient Instructions (Addendum)
Take doxycycline 100 mg twice daily for 7 days. Use ofloxacin ophthalmic drops in left eye 4 times daily for 5-7 days. If not better in one week, you will be referred to ophthalmologist. Warm hot compresses for 15 minutes 4 times daily.

## 2015-06-29 ENCOUNTER — Telehealth: Payer: Self-pay | Admitting: Internal Medicine

## 2015-06-29 NOTE — Telephone Encounter (Signed)
Patient's Mom, Lynn Schneider calling states that patient has a sty on her eye and is being seen today by eye doctor.  However, she has [facility] [employer] and has to have a referral.  She had appointment this a.m. And they rescheduled her appointment for 3:15 this afternoon stating that patient MUST have a referral from our office PRIOR to being seen.    [facility] Referral done to Dr. Jimmye NormanSteven Miller at [facility] [phone]) and faxed to them @ [phone] Dx:  [phone] per Dr. Rondel BatonMiller's office.   Referral #[id number] (scanned in) Called Mom back and provided referral number to Mom as well.

## 2016-04-06 DIAGNOSIS — H01001 Unspecified blepharitis right upper eyelid: Secondary | ICD-10-CM | POA: Diagnosis not present

## 2016-05-28 ENCOUNTER — Other Ambulatory Visit: Payer: BLUE CROSS/BLUE SHIELD | Admitting: Internal Medicine

## 2016-05-28 DIAGNOSIS — Z1321 Encounter for screening for nutritional disorder: Secondary | ICD-10-CM | POA: Diagnosis not present

## 2016-05-28 DIAGNOSIS — Z1329 Encounter for screening for other suspected endocrine disorder: Secondary | ICD-10-CM

## 2016-05-28 DIAGNOSIS — Z0000 Encounter for general adult medical examination without abnormal findings: Secondary | ICD-10-CM

## 2016-05-28 DIAGNOSIS — Z13220 Encounter for screening for lipoid disorders: Secondary | ICD-10-CM | POA: Diagnosis not present

## 2016-05-28 DIAGNOSIS — Z130 Encounter for screening for diseases of the blood and blood-forming organs and certain disorders involving the immune mechanism: Secondary | ICD-10-CM | POA: Diagnosis not present

## 2016-05-28 LAB — LIPID PANEL
Cholesterol: 155 mg/dL (ref <200)
HDL: 29 mg/dL — AB (ref >50)
LDL Cholesterol: 98 mg/dL (ref <100)
TRIGLYCERIDES: 138 mg/dL (ref <150)
Total CHOL/HDL Ratio: 5.3 Ratio — ABNORMAL HIGH (ref <5.0)
VLDL: 28 mg/dL (ref <30)

## 2016-05-28 LAB — CBC WITH DIFFERENTIAL/PLATELET
BASOS ABS: 0 {cells}/uL (ref 0–200)
Basophils Relative: 0 %
EOS ABS: 89 {cells}/uL (ref 15–500)
Eosinophils Relative: 1 %
HCT: 43.3 % (ref 35.0–45.0)
HEMOGLOBIN: 14.2 g/dL (ref 11.7–15.5)
Lymphocytes Relative: 28 %
Lymphs Abs: 2492 cells/uL (ref 850–3900)
MCH: 30.7 pg (ref 27.0–33.0)
MCHC: 32.8 g/dL (ref 32.0–36.0)
MCV: 93.7 fL (ref 80.0–100.0)
MONOS PCT: 6 %
MPV: 9.3 fL (ref 7.5–12.5)
Monocytes Absolute: 534 cells/uL (ref 200–950)
NEUTROS PCT: 65 %
Neutro Abs: 5785 cells/uL (ref 1500–7800)
PLATELETS: 263 10*3/uL (ref 140–400)
RBC: 4.62 MIL/uL (ref 3.80–5.10)
RDW: 13.2 % (ref 11.0–15.0)
WBC: 8.9 10*3/uL (ref 3.8–10.8)

## 2016-05-28 LAB — COMPLETE METABOLIC PANEL WITH GFR
ALBUMIN: 4 g/dL (ref 3.6–5.1)
ALK PHOS: 52 U/L (ref 33–115)
ALT: 7 U/L (ref 6–29)
AST: 11 U/L (ref 10–30)
BILIRUBIN TOTAL: 0.2 mg/dL (ref 0.2–1.2)
BUN: 20 mg/dL (ref 7–25)
CO2: 24 mmol/L (ref 20–31)
Calcium: 9.1 mg/dL (ref 8.6–10.2)
Chloride: 108 mmol/L (ref 98–110)
Creat: 0.59 mg/dL (ref 0.50–1.10)
Glucose, Bld: 86 mg/dL (ref 65–99)
Potassium: 4.3 mmol/L (ref 3.5–5.3)
Sodium: 139 mmol/L (ref 135–146)
TOTAL PROTEIN: 6.3 g/dL (ref 6.1–8.1)

## 2016-05-28 LAB — TSH: TSH: 1.32 mIU/L

## 2016-05-29 ENCOUNTER — Encounter: Payer: Self-pay | Admitting: Internal Medicine

## 2016-05-29 ENCOUNTER — Other Ambulatory Visit (HOSPITAL_COMMUNITY)
Admission: RE | Admit: 2016-05-29 | Discharge: 2016-05-29 | Disposition: A | Discharge status: Home / Self Care | Payer: BLUE CROSS/BLUE SHIELD | Source: Ambulatory Visit | Attending: Internal Medicine | Admitting: Internal Medicine

## 2016-05-29 ENCOUNTER — Ambulatory Visit (INDEPENDENT_AMBULATORY_CARE_PROVIDER_SITE_OTHER): Payer: BLUE CROSS/BLUE SHIELD | Admitting: Internal Medicine

## 2016-05-29 VITALS — BP 98/78 | HR 64 | Temp 98.6°F | Ht 63.0 in | Wt 148.0 lb

## 2016-05-29 DIAGNOSIS — Z113 Encounter for screening for infections with a predominantly sexual mode of transmission: Secondary | ICD-10-CM | POA: Insufficient documentation

## 2016-05-29 DIAGNOSIS — E559 Vitamin D deficiency, unspecified: Secondary | ICD-10-CM

## 2016-05-29 DIAGNOSIS — Z0000 Encounter for general adult medical examination without abnormal findings: Secondary | ICD-10-CM | POA: Diagnosis not present

## 2016-05-29 DIAGNOSIS — Z148 Genetic carrier of other disease: Secondary | ICD-10-CM

## 2016-05-29 DIAGNOSIS — Z23 Encounter for immunization: Secondary | ICD-10-CM | POA: Diagnosis not present

## 2016-05-29 DIAGNOSIS — Z01419 Encounter for gynecological examination (general) (routine) without abnormal findings: Secondary | ICD-10-CM | POA: Insufficient documentation

## 2016-05-29 LAB — VITAMIN D 25 HYDROXY (VIT D DEFICIENCY, FRACTURES): Vit D, 25-Hydroxy: 28 ng/mL — ABNORMAL LOW (ref 30–100)

## 2016-05-29 LAB — POCT URINALYSIS DIPSTICK
BILIRUBIN UA: NEGATIVE
Blood, UA: NEGATIVE
GLUCOSE UA: NEGATIVE
Ketones, UA: NEGATIVE
LEUKOCYTES UA: NEGATIVE
NITRITE UA: NEGATIVE
Protein, UA: NEGATIVE
Spec Grav, UA: 1.02
Urobilinogen, UA: NEGATIVE
pH, UA: 6

## 2016-05-29 MED ORDER — NORGESTIM-ETH ESTRAD TRIPHASIC 0.18/0.215/0.25 MG-35 MCG PO TABS: 1.0000 | ORAL_TABLET | Freq: Every day | ORAL | 3 refills | Status: DC

## 2016-05-30 LAB — CYTOLOGY - PAP: DIAGNOSIS: NEGATIVE

## 2016-05-30 LAB — CERVICOVAGINAL ANCILLARY ONLY
CHLAMYDIA, DNA PROBE: NEGATIVE
NEISSERIA GONORRHEA: NEGATIVE

## 2016-06-03 NOTE — Patient Instructions (Signed)
It was a pleasure to see you today. Flu vaccine given. Take 2000 units vitamin D 3 daily for vitamin D deficiency.

## 2016-06-03 NOTE — Progress Notes (Signed)
Subjective:    Patient ID: Lynn Schneider, female    DOB: 03/07/1986, 30 y.o.   MRN: [id number]  HPI Pleasant 30 year old White Female in today for health maintenance exam. Flu vaccine given today. She has a history of depression and is a carrier of fragile X chromosome. She has some learning disabilities. 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 She's been there since April. She lives in a small studio apartment by herself. Says it's been a challenge getting to know people in [place].  She was hospitalized February 2012 for Unisom overdose and was [facility].  [facility] when she was [age] 1/30 years old. She previously had been diagnosed by Dr. Jane CanarySharpless with attention deficit disorder. She was on Ritalin for a number of years and subsequently was on Concerta.  Family and Social History: She is single never married. Her biological father died when she was approximately 30 years old of an acute heart attack. He previously had been incarcerated for selling drugs for some 10 years. Her mother remarried and her new husband adopted Lynn Schneider but not her older sister. Mother has since divorced and is single again. Maternal female cousin with fragile X syndrome. Maternal uncle has history of schizophrenia diagnosed at age 30. Mother is in good health.  Tetanus immunization given 04/28/2010. Received meningococcal vaccine August 2003 before going to preparatory school in [place]. She also had a PPD at that time.  She is on oral contraceptives but denies being sexually active.  Lynn Schneider was tested at [facility] and has been told she has chromosome for fragile X syndrome. We do not have that report on file. She has always struggled in school. She attended [employer] for short time and did not do well. She lived on campus.    Review of Systems no new  complaints     Objective:   Physical Exam  Constitutional: She is oriented to person, place, and time. She appears well-developed and well-nourished. No distress.  HENT:  Head: Normocephalic and atraumatic.  Right Ear: External ear normal.  Left Ear: External ear normal.  Mouth/Throat: Oropharynx is clear and moist. No oropharyngeal exudate.  Eyes: Conjunctivae are normal. Pupils are equal, round, and reactive to light. Right eye exhibits no discharge. Left eye exhibits no discharge. No scleral icterus.  Neck: Neck supple. No JVD present. No thyromegaly present.  Cardiovascular: Normal rate, regular rhythm and normal heart sounds.   No murmur heard. Pulmonary/Chest: Effort normal and breath sounds normal. No respiratory distress. She has no wheezes. She has no rales.  Breasts normal female  Abdominal: Soft. Bowel sounds are normal. She exhibits no distension. There is no tenderness. There is no rebound and no guarding.  Genitourinary:  Genitourinary Comments: Pap taken. No masses on bimanual exam. GC and chlamydia probes taken.  Musculoskeletal: She exhibits no edema.  Lymphadenopathy:    She has no cervical adenopathy.  Neurological: She is alert and oriented to person, place, and time. She has normal reflexes. No cranial nerve deficit. Coordination normal.  Skin: Skin is warm and dry. No rash noted. She is not diaphoretic.  Psychiatric: She has a normal mood and affect. Her behavior is normal. Judgment and thought content normal.  Vitals reviewed.         Assessment & Plan:  Normal health maintenance exam  History of attention deficit disorder  Fragile X chromosome carrier  History of depression  Low HDL at 29-remainder of lipid panel is within normal limits.  Plan: Refill oral contraceptives for one year. Return in one year or as needed.  Vitamin D deficiency-recommend 2000 units vitamin D 3 daily  Plan: Flu vaccine given. GC and bradycardia of probes are negative.  Pap smear is within normal limits.

## 2017-04-05 DIAGNOSIS — R51 Headache: Secondary | ICD-10-CM | POA: Diagnosis not present

## 2017-04-29 ENCOUNTER — Other Ambulatory Visit: Payer: Self-pay

## 2017-04-29 MED ORDER — NORGESTIM-ETH ESTRAD TRIPHASIC 0.18/0.215/0.25 MG-35 MCG PO TABS: 1.0000 | ORAL_TABLET | Freq: Every day | ORAL | 3 refills | Status: DC

## 2017-07-08 ENCOUNTER — Other Ambulatory Visit: Payer: Self-pay | Admitting: Internal Medicine

## 2017-07-08 DIAGNOSIS — Z0000 Encounter for general adult medical examination without abnormal findings: Secondary | ICD-10-CM

## 2017-07-08 DIAGNOSIS — Z1329 Encounter for screening for other suspected endocrine disorder: Secondary | ICD-10-CM

## 2017-07-08 DIAGNOSIS — E785 Hyperlipidemia, unspecified: Secondary | ICD-10-CM

## 2017-07-08 DIAGNOSIS — E559 Vitamin D deficiency, unspecified: Secondary | ICD-10-CM

## 2017-07-19 ENCOUNTER — Other Ambulatory Visit: Payer: BLUE CROSS/BLUE SHIELD | Admitting: Internal Medicine

## 2017-07-22 ENCOUNTER — Encounter: Payer: BLUE CROSS/BLUE SHIELD | Admitting: Internal Medicine

## 2018-01-25 DIAGNOSIS — F430 Acute stress reaction: Secondary | ICD-10-CM | POA: Diagnosis not present

## 2018-01-27 ENCOUNTER — Ambulatory Visit
Admission: RE | Admit: 2018-01-27 | Discharge: 2018-01-27 | Disposition: A | Discharge status: Home / Self Care | Payer: BLUE CROSS/BLUE SHIELD | Source: Ambulatory Visit | Attending: Internal Medicine | Admitting: Internal Medicine

## 2018-01-27 ENCOUNTER — Ambulatory Visit: Payer: BLUE CROSS/BLUE SHIELD | Admitting: Internal Medicine

## 2018-01-27 ENCOUNTER — Encounter: Payer: Self-pay | Admitting: Internal Medicine

## 2018-01-27 ENCOUNTER — Telehealth: Payer: Self-pay

## 2018-01-27 DIAGNOSIS — G44319 Acute post-traumatic headache, not intractable: Secondary | ICD-10-CM | POA: Diagnosis not present

## 2018-01-27 DIAGNOSIS — M25512 Pain in left shoulder: Secondary | ICD-10-CM | POA: Diagnosis not present

## 2018-01-27 DIAGNOSIS — M542 Cervicalgia: Secondary | ICD-10-CM | POA: Diagnosis not present

## 2018-01-27 DIAGNOSIS — S134XXA Sprain of ligaments of cervical spine, initial encounter: Secondary | ICD-10-CM | POA: Diagnosis not present

## 2018-01-27 DIAGNOSIS — R51 Headache: Secondary | ICD-10-CM | POA: Diagnosis not present

## 2018-01-27 DIAGNOSIS — S0990XA Unspecified injury of head, initial encounter: Secondary | ICD-10-CM | POA: Diagnosis not present
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
## 2018-01-27 NOTE — Patient Instructions (Signed)
To have CT of the brain without contrast, C-spine films and left shoulder x-ray.  Take Aleve or Advil for musculoskeletal pain.  Ice to the left neck spasm 20 minutes twice daily.

## 2018-01-27 NOTE — Telephone Encounter (Signed)
[facility] starting 01/27/18-02/25/18 [id number]

## 2018-02-13 DIAGNOSIS — F4325 Adjustment disorder with mixed disturbance of emotions and conduct: Secondary | ICD-10-CM | POA: Diagnosis not present

## 2018-02-13 DIAGNOSIS — F330 Major depressive disorder, recurrent, mild: Secondary | ICD-10-CM | POA: Diagnosis not present

## 2018-03-01 DIAGNOSIS — F4323 Adjustment disorder with mixed anxiety and depressed mood: Secondary | ICD-10-CM | POA: Diagnosis not present

## 2018-03-27 DIAGNOSIS — F4323 Adjustment disorder with mixed anxiety and depressed mood: Secondary | ICD-10-CM | POA: Diagnosis not present

## 2018-05-06 ENCOUNTER — Other Ambulatory Visit: Payer: Self-pay | Admitting: Internal Medicine

## 2018-05-06 NOTE — Telephone Encounter (Signed)
Needs CPE booked before can refill last Dec 2018

## 2018-05-08 NOTE — Telephone Encounter (Signed)
Called patient and LM that she needs to make and appointment for her CPE.  Advised that her refill request for Ortho Tri-Cyclen will not be refill until CPE made.

## 2018-05-08 NOTE — Telephone Encounter (Signed)
Patient called back and booked CPE for 07/10/18 @ 3:00 p.m. Labs 07/07/18.   Ok to send refill to get her through to her CPE please.    Pharmacy:  Still [facility] on [address].

## 2018-07-07 ENCOUNTER — Other Ambulatory Visit: Payer: BLUE CROSS/BLUE SHIELD | Admitting: Internal Medicine

## 2018-07-07 DIAGNOSIS — Z1329 Encounter for screening for other suspected endocrine disorder: Secondary | ICD-10-CM | POA: Diagnosis not present

## 2018-07-07 DIAGNOSIS — E785 Hyperlipidemia, unspecified: Secondary | ICD-10-CM

## 2018-07-07 DIAGNOSIS — E559 Vitamin D deficiency, unspecified: Secondary | ICD-10-CM | POA: Diagnosis not present

## 2018-07-07 DIAGNOSIS — Z0000 Encounter for general adult medical examination without abnormal findings: Secondary | ICD-10-CM

## 2018-07-08 LAB — CBC WITH DIFFERENTIAL/PLATELET
ABSOLUTE MONOCYTES: 408 {cells}/uL (ref 200–950)
BASOS PCT: 0.7 %
Basophils Absolute: 48 cells/uL (ref 0–200)
EOS ABS: 48 {cells}/uL (ref 15–500)
Eosinophils Relative: 0.7 %
HCT: 41.5 % (ref 35.0–45.0)
HEMOGLOBIN: 14.5 g/dL (ref 11.7–15.5)
Lymphs Abs: 2020 cells/uL (ref 850–3900)
MCH: 31.3 pg (ref 27.0–33.0)
MCHC: 34.9 g/dL (ref 32.0–36.0)
MCV: 89.6 fL (ref 80.0–100.0)
MONOS PCT: 6 %
MPV: 9.4 fL (ref 7.5–12.5)
NEUTROS ABS: 4277 {cells}/uL (ref 1500–7800)
Neutrophils Relative %: 62.9 %
PLATELETS: 337 10*3/uL (ref 140–400)
RBC: 4.63 10*6/uL (ref 3.80–5.10)
RDW: 12.2 % (ref 11.0–15.0)
TOTAL LYMPHOCYTE: 29.7 %
WBC: 6.8 10*3/uL (ref 3.8–10.8)

## 2018-07-08 LAB — COMPLETE METABOLIC PANEL WITH GFR
AG Ratio: 1.4 (calc) (ref 1.0–2.5)
ALT: 9 U/L (ref 6–29)
AST: 11 U/L (ref 10–30)
Albumin: 3.9 g/dL (ref 3.6–5.1)
Alkaline phosphatase (APISO): 56 U/L (ref 33–115)
BUN: 16 mg/dL (ref 7–25)
CO2: 24 mmol/L (ref 20–32)
Calcium: 9.3 mg/dL (ref 8.6–10.2)
Chloride: 107 mmol/L (ref 98–110)
Creat: 0.74 mg/dL (ref 0.50–1.10)
GFR, Est African American: 124 mL/min/{1.73_m2} (ref >=60)
GFR, Est Non African American: 107 mL/min/{1.73_m2} (ref >=60)
Globulin: 2.7 g/dL (calc) (ref 1.9–3.7)
Glucose, Bld: 97 mg/dL (ref 65–99)
Potassium: 4.6 mmol/L (ref 3.5–5.3)
Sodium: 141 mmol/L (ref 135–146)
Total Bilirubin: 0.5 mg/dL (ref 0.2–1.2)
Total Protein: 6.6 g/dL (ref 6.1–8.1)

## 2018-07-08 LAB — VITAMIN D 25 HYDROXY (VIT D DEFICIENCY, FRACTURES): Vit D, 25-Hydroxy: 47 ng/mL (ref 30–100)

## 2018-07-08 LAB — LIPID PANEL
Cholesterol: 193 mg/dL (ref <200)
HDL: 43 mg/dL — ABNORMAL LOW (ref >50)
LDL Cholesterol (Calc): 124 mg/dL (calc) — ABNORMAL HIGH
Non-HDL Cholesterol (Calc): 150 mg/dL (calc) — ABNORMAL HIGH (ref <130)
Total CHOL/HDL Ratio: 4.5 (calc) (ref <5.0)
Triglycerides: 148 mg/dL (ref <150)

## 2018-07-08 LAB — TSH: TSH: 0.83 mIU/L

## 2018-07-10 ENCOUNTER — Encounter: Payer: Self-pay | Admitting: Internal Medicine

## 2018-07-10 ENCOUNTER — Other Ambulatory Visit (HOSPITAL_COMMUNITY)
Admission: RE | Admit: 2018-07-10 | Discharge: 2018-07-10 | Disposition: A | Discharge status: Home / Self Care | Payer: BLUE CROSS/BLUE SHIELD | Source: Ambulatory Visit | Attending: Internal Medicine | Admitting: Internal Medicine

## 2018-07-10 ENCOUNTER — Ambulatory Visit (INDEPENDENT_AMBULATORY_CARE_PROVIDER_SITE_OTHER): Payer: BLUE CROSS/BLUE SHIELD | Admitting: Internal Medicine

## 2018-07-10 VITALS — BP 100/70 | HR 78 | Ht 63.0 in | Wt 151.0 lb

## 2018-07-10 DIAGNOSIS — R8290 Unspecified abnormal findings in urine: Secondary | ICD-10-CM | POA: Diagnosis not present

## 2018-07-10 DIAGNOSIS — Z0000 Encounter for general adult medical examination without abnormal findings: Secondary | ICD-10-CM

## 2018-07-10 DIAGNOSIS — Z124 Encounter for screening for malignant neoplasm of cervix: Secondary | ICD-10-CM | POA: Diagnosis not present

## 2018-07-10 DIAGNOSIS — Z23 Encounter for immunization: Secondary | ICD-10-CM | POA: Diagnosis not present

## 2018-07-10 DIAGNOSIS — E7800 Pure hypercholesterolemia, unspecified: Secondary | ICD-10-CM

## 2018-07-10 LAB — POCT URINALYSIS DIPSTICK
APPEARANCE: NEGATIVE
Bilirubin, UA: NEGATIVE
Glucose, UA: NEGATIVE
KETONES UA: NEGATIVE
NITRITE UA: NEGATIVE
ODOR: NEGATIVE
PH UA: 6 (ref 5.0–8.0)
PROTEIN UA: NEGATIVE
Spec Grav, UA: 1.015 (ref 1.010–1.025)
UROBILINOGEN UA: 0.2 U/dL

## 2018-07-10 MED ORDER — FLUCONAZOLE 150 MG PO TABS: 150.0000 mg | ORAL_TABLET | Freq: Once | ORAL | 1 refills | Status: AC

## 2018-07-11 LAB — URINE CULTURE
MICRO NUMBER:: 3025
SPECIMEN QUALITY: ADEQUATE

## 2018-07-15 LAB — CYTOLOGY - PAP
Diagnosis: NEGATIVE
HPV: NOT DETECTED

## 2018-08-02 NOTE — Patient Instructions (Signed)
Flu vaccine given.  No evidence of urinary tract infection on urine culture despite abnormal urinalysis.  Return in 1 year or as needed.
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
## 2018-08-22 ENCOUNTER — Other Ambulatory Visit: Payer: Self-pay | Admitting: Internal Medicine

## 2018-12-09 ENCOUNTER — Other Ambulatory Visit: Payer: Self-pay

## 2018-12-09 MED ORDER — NORGESTIM-ETH ESTRAD TRIPHASIC 0.18/0.215/0.25 MG-35 MCG PO TABS: 1.0000 | ORAL_TABLET | Freq: Every day | ORAL | 1 refills | Status: DC

## 2019-01-08 ENCOUNTER — Other Ambulatory Visit: Payer: BLUE CROSS/BLUE SHIELD | Admitting: Internal Medicine

## 2019-01-12 ENCOUNTER — Other Ambulatory Visit: Payer: BLUE CROSS/BLUE SHIELD | Admitting: Internal Medicine

## 2019-03-24 ENCOUNTER — Other Ambulatory Visit: Payer: Self-pay

## 2019-03-24 ENCOUNTER — Encounter: Payer: Self-pay | Admitting: Internal Medicine

## 2019-03-24 ENCOUNTER — Ambulatory Visit (INDEPENDENT_AMBULATORY_CARE_PROVIDER_SITE_OTHER): Payer: BC Managed Care – PPO | Admitting: Internal Medicine

## 2019-03-24 VITALS — Ht 63.0 in | Wt 151.0 lb

## 2019-03-24 DIAGNOSIS — Z23 Encounter for immunization: Secondary | ICD-10-CM

## 2019-03-24 NOTE — Progress Notes (Signed)
Flu vaccine given by CMA 

## 2019-03-24 NOTE — Patient Instructions (Signed)
Patient received a flu vaccine IM L deltoid, AV, CMA  

## 2019-05-04 DIAGNOSIS — Z23 Encounter for immunization: Secondary | ICD-10-CM

## 2019-05-12 ENCOUNTER — Telehealth: Payer: Self-pay | Admitting: Internal Medicine

## 2019-05-12 ENCOUNTER — Encounter: Payer: Self-pay | Admitting: Internal Medicine

## 2019-05-12 NOTE — Telephone Encounter (Signed)
Mailed letter to call office and schedule CPE due after 07/11/2019

## 2019-06-01 DIAGNOSIS — Z20828 Contact with and (suspected) exposure to other viral communicable diseases: Secondary | ICD-10-CM | POA: Diagnosis not present

## 2019-06-02 ENCOUNTER — Other Ambulatory Visit: Payer: Self-pay

## 2019-06-02 DIAGNOSIS — Z20822 Contact with and (suspected) exposure to covid-19: Secondary | ICD-10-CM

## 2019-06-03 LAB — NOVEL CORONAVIRUS, NAA: SARS-CoV-2, NAA: NOT DETECTED

## 2019-06-23 ENCOUNTER — Telehealth: Payer: Self-pay | Admitting: Internal Medicine

## 2019-06-23 MED ORDER — NORGESTIM-ETH ESTRAD TRIPHASIC 0.18/0.215/0.25 MG-35 MCG PO TABS: 1.0000 | ORAL_TABLET | Freq: Every day | ORAL | 1 refills | Status: DC

## 2019-06-23 NOTE — Telephone Encounter (Signed)
Received Fax RX request from  [facility] [place], [place] - [address] Phone:  [phone]  Fax:  [phone]       Medication -  Norgestimate-Ethinyl Estradiol Triphasic (TRI-SPRINTEC) 0.18/0.215/0.25 MG-35 MCG tablet    Last Refill - 03/20/19  Last OV - 07/10/18  Last CPE - 07/10/18  Next Appointment - 09/15/19

## 2019-09-14 ENCOUNTER — Other Ambulatory Visit: Payer: BC Managed Care – PPO | Admitting: Internal Medicine

## 2019-09-14 ENCOUNTER — Other Ambulatory Visit: Payer: Self-pay

## 2019-09-14 DIAGNOSIS — Z0000 Encounter for general adult medical examination without abnormal findings: Secondary | ICD-10-CM | POA: Diagnosis not present

## 2019-09-14 DIAGNOSIS — Z1329 Encounter for screening for other suspected endocrine disorder: Secondary | ICD-10-CM

## 2019-09-14 DIAGNOSIS — Z148 Genetic carrier of other disease: Secondary | ICD-10-CM

## 2019-09-14 DIAGNOSIS — Z1321 Encounter for screening for nutritional disorder: Secondary | ICD-10-CM

## 2019-09-14 DIAGNOSIS — E7800 Pure hypercholesterolemia, unspecified: Secondary | ICD-10-CM

## 2019-09-15 ENCOUNTER — Ambulatory Visit (INDEPENDENT_AMBULATORY_CARE_PROVIDER_SITE_OTHER): Payer: BC Managed Care – PPO | Admitting: Internal Medicine

## 2019-09-15 ENCOUNTER — Encounter: Payer: Self-pay | Admitting: Internal Medicine

## 2019-09-15 ENCOUNTER — Other Ambulatory Visit: Payer: Self-pay

## 2019-09-15 VITALS — BP 100/80 | HR 82 | Temp 98.0°F | Ht 63.0 in | Wt 153.0 lb

## 2019-09-15 DIAGNOSIS — Z0000 Encounter for general adult medical examination without abnormal findings: Secondary | ICD-10-CM

## 2019-09-15 DIAGNOSIS — E782 Mixed hyperlipidemia: Secondary | ICD-10-CM | POA: Diagnosis not present

## 2019-09-15 LAB — CBC WITH DIFFERENTIAL/PLATELET
Absolute Monocytes: 448 cells/uL (ref 200–950)
Basophils Absolute: 42 cells/uL (ref 0–200)
Basophils Relative: 0.5 %
Eosinophils Absolute: 58 cells/uL (ref 15–500)
Eosinophils Relative: 0.7 %
HCT: 43.9 % (ref 35.0–45.0)
Hemoglobin: 14.9 g/dL (ref 11.7–15.5)
Lymphs Abs: 2150 cells/uL (ref 850–3900)
MCH: 30.8 pg (ref 27.0–33.0)
MCHC: 33.9 g/dL (ref 32.0–36.0)
MCV: 90.9 fL (ref 80.0–100.0)
MPV: 9.3 fL (ref 7.5–12.5)
Monocytes Relative: 5.4 %
Neutro Abs: 5603 cells/uL (ref 1500–7800)
Neutrophils Relative %: 67.5 %
Platelets: 317 10*3/uL (ref 140–400)
RBC: 4.83 10*6/uL (ref 3.80–5.10)
RDW: 12 % (ref 11.0–15.0)
Total Lymphocyte: 25.9 %
WBC: 8.3 10*3/uL (ref 3.8–10.8)

## 2019-09-15 LAB — POCT URINALYSIS DIPSTICK
Appearance: NEGATIVE
Bilirubin, UA: NEGATIVE
Glucose, UA: NEGATIVE
Ketones, UA: NEGATIVE
Leukocytes, UA: NEGATIVE
Nitrite, UA: NEGATIVE
Odor: NEGATIVE
Protein, UA: NEGATIVE
Spec Grav, UA: 1.01 (ref 1.010–1.025)
Urobilinogen, UA: 0.2 E.U./dL
pH, UA: 6.5 (ref 5.0–8.0)

## 2019-09-15 LAB — COMPLETE METABOLIC PANEL WITH GFR
AG Ratio: 1.6 (calc) (ref 1.0–2.5)
ALT: 17 U/L (ref 6–29)
AST: 13 U/L (ref 10–30)
Albumin: 4.1 g/dL (ref 3.6–5.1)
Alkaline phosphatase (APISO): 50 U/L (ref 31–125)
BUN: 12 mg/dL (ref 7–25)
CO2: 25 mmol/L (ref 20–32)
Calcium: 9.2 mg/dL (ref 8.6–10.2)
Chloride: 105 mmol/L (ref 98–110)
Creat: 0.57 mg/dL (ref 0.50–1.10)
GFR, Est African American: 141 mL/min/{1.73_m2} (ref >=60)
GFR, Est Non African American: 122 mL/min/{1.73_m2} (ref >=60)
Globulin: 2.5 g/dL (calc) (ref 1.9–3.7)
Glucose, Bld: 87 mg/dL (ref 65–99)
Potassium: 4.2 mmol/L (ref 3.5–5.3)
Sodium: 138 mmol/L (ref 135–146)
Total Bilirubin: 0.4 mg/dL (ref 0.2–1.2)
Total Protein: 6.6 g/dL (ref 6.1–8.1)

## 2019-09-15 LAB — LIPID PANEL
Cholesterol: 190 mg/dL (ref <200)
HDL: 44 mg/dL — ABNORMAL LOW (ref >=50)
LDL Cholesterol (Calc): 116 mg/dL (calc) — ABNORMAL HIGH
Non-HDL Cholesterol (Calc): 146 mg/dL (calc) — ABNORMAL HIGH (ref <130)
Total CHOL/HDL Ratio: 4.3 (calc) (ref <5.0)
Triglycerides: 186 mg/dL — ABNORMAL HIGH (ref <150)

## 2019-09-15 LAB — TSH: TSH: 1.05 mIU/L
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
## 2019-09-27 NOTE — Patient Instructions (Signed)
It was a pleasure to see you today.  Watch diet and continue your exercise program.  Follow-up in 1 year.

## 2019-12-11 ENCOUNTER — Other Ambulatory Visit: Payer: Self-pay | Admitting: Internal Medicine

## 2019-12-11 MED ORDER — NORGESTIM-ETH ESTRAD TRIPHASIC 0.18/0.215/0.25 MG-35 MCG PO TABS: 1.0000 | ORAL_TABLET | Freq: Every day | ORAL | 3 refills | Status: DC

## 2019-12-11 NOTE — Telephone Encounter (Signed)
Had CPE on 09/15/2019

## 2019-12-11 NOTE — Telephone Encounter (Signed)
Fax received from [employer] [facility] Tri-Sprintec Tablets 28s Last filled: 09/14/19 Last OV/CPE: 09/15/19 Next CPE due: 09/15/20
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
## 2020-04-12 DIAGNOSIS — H53143 Visual discomfort, bilateral: Secondary | ICD-10-CM | POA: Diagnosis not present

## 2020-04-13 ENCOUNTER — Encounter: Payer: Self-pay | Admitting: Internal Medicine

## 2020-04-13 ENCOUNTER — Other Ambulatory Visit: Payer: Self-pay

## 2020-04-13 ENCOUNTER — Ambulatory Visit (INDEPENDENT_AMBULATORY_CARE_PROVIDER_SITE_OTHER): Payer: BC Managed Care – PPO | Admitting: Internal Medicine

## 2020-04-13 VITALS — BP 90/60 | HR 82 | Temp 98.3°F | Ht 63.0 in | Wt 153.0 lb

## 2020-04-13 DIAGNOSIS — Z23 Encounter for immunization: Secondary | ICD-10-CM | POA: Diagnosis not present

## 2020-04-13 NOTE — Patient Instructions (Signed)
Patient received a flu vaccine IM L deltoid, AV, CMA  

## 2020-04-13 NOTE — Progress Notes (Signed)
   Subjective:    Patient ID: Lynn Schneider, female    DOB: July 19, 1985, 34 y.o.   MRN: [id number]  HPI Flu vaccine given by CMA    Review of Systems     Objective:   Physical Exam        Assessment & Plan:

## 2020-07-22 ENCOUNTER — Other Ambulatory Visit: Payer: Self-pay

## 2020-07-22 ENCOUNTER — Telehealth: Payer: Self-pay | Admitting: Internal Medicine

## 2020-07-22 ENCOUNTER — Encounter: Payer: Self-pay | Admitting: Internal Medicine

## 2020-07-22 ENCOUNTER — Ambulatory Visit (INDEPENDENT_AMBULATORY_CARE_PROVIDER_SITE_OTHER): Payer: BC Managed Care – PPO | Admitting: Internal Medicine

## 2020-07-22 VITALS — HR 73 | Temp 97.7°F

## 2020-07-22 DIAGNOSIS — J029 Acute pharyngitis, unspecified: Secondary | ICD-10-CM

## 2020-07-22 DIAGNOSIS — Z20822 Contact with and (suspected) exposure to covid-19: Secondary | ICD-10-CM | POA: Diagnosis not present

## 2020-07-22 MED ORDER — AZITHROMYCIN 250 MG PO TABS: ORAL_TABLET | ORAL | 0 refills | Status: DC

## 2020-07-22 MED ORDER — FLUCONAZOLE 150 MG PO TABS: ORAL_TABLET | ORAL | 1 refills | Status: DC

## 2020-07-22 NOTE — Telephone Encounter (Signed)
Scheduled
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
## 2020-07-22 NOTE — Telephone Encounter (Signed)
Lynn Schneider [phone]  [name] called to say that today she has scratchy throat and ears feel stopped up. This morning her mom tested positive with an at home COVID test, her mom was exposed at work.  So she is concerned about that.

## 2020-07-24 LAB — SARS-COV-2 RNA,(COVID-19) QUALITATIVE NAAT: SARS CoV2 RNA: NOT DETECTED

## 2020-07-25 NOTE — Patient Instructions (Addendum)
Quarantine at home until COVID test result is back.  Take Zithromax Z-PAK 2 tabs day 1 followed by 1 tab days 2 through 5.  Rest and drink plenty of fluids.  May take Tylenol for throat pain or fever.  Diflucan prescribed if needed for Candida vaginitis on antibiotics.

## 2020-07-25 NOTE — Progress Notes (Signed)
   Subjective:    Patient ID: Lynn Schneider, female    DOB: Mar 30, 1986, 35 y.o.   MRN: [id number]  HPI 35 year old Female called today complaining of scratchy throat and ear congestion.  She found out this morning that her mother tested positive for COVID-19 with a home test.  Apparently mother was exposed through work.  Patient is concerned because of close contact. Patient has her own apartment but exposed to her Mom through work and social interactions.    Review of Systems denies fever, chills, headache, dysgeusia.     Objective:   Physical Exam Temp 97.7 degrees, pulse 73 regular pulse ox 97% Seen in NAD. Pharynx slightly injected without exudate.  TMs are clear.  Chest is clear to auscultation without rales or wheezing.  COVID-19 test obtained via nasal swab.      Assessment & Plan:  Acute upper respiratory infection-rule out COVID-19 infection  Plan: COVID-19 test obtained and is pending.  Patient will be placed on Zithromax Z-PAK 2 tabs day 1 followed by 1 tab days 2 through 5.  Quarantine at home until test results are back.  Rest and drink plenty of fluids.  Tylenol if needed for throat pain or fever.  Addendum: July 24, 2020-COVID-19 test result is negative.  Called patient.  Got voicemail and left message that result was negative.

## 2020-11-13 ENCOUNTER — Other Ambulatory Visit: Payer: Self-pay | Admitting: Internal Medicine

## 2020-11-15 ENCOUNTER — Telehealth: Payer: Self-pay

## 2020-11-15 NOTE — Telephone Encounter (Signed)
Pain soreness on her cheekbone area, started yesterday she feels said better today she said she just wanted to make you aware of it and will take tylenol and she will call back if not better. No fever, no sinus symptoms.

## 2020-11-16 ENCOUNTER — Other Ambulatory Visit: Payer: Self-pay

## 2020-11-16 ENCOUNTER — Ambulatory Visit: Payer: BC Managed Care – PPO | Admitting: Internal Medicine

## 2020-11-16 ENCOUNTER — Encounter: Payer: Self-pay | Admitting: Internal Medicine

## 2020-11-16 VITALS — BP 110/80 | HR 73 | Temp 97.8°F | Ht 63.0 in | Wt 130.0 lb

## 2020-11-16 DIAGNOSIS — H1031 Unspecified acute conjunctivitis, right eye: Secondary | ICD-10-CM | POA: Diagnosis not present

## 2020-11-16 MED ORDER — OFLOXACIN 0.3 % OP SOLN: 2.0000 [drp] | Freq: Four times a day (QID) | OPHTHALMIC | 1 refills | Status: DC

## 2020-11-16 NOTE — Patient Instructions (Addendum)
Instill 2 drops of Ocuflox ophthalmic solution into right eye 4 times a day for 5 to 7 days.  Apply warm hot compresses to right eye for 20 minutes 2-3 times daily.  Call if not improving in 24 to 48 hours or sooner if worse.  Keep appointment for health maintenance exam later this month.

## 2020-11-16 NOTE — Progress Notes (Signed)
   Subjective:    Patient ID: Lynn Schneider, female    DOB: [birth year], 35 y.o.   MRN: [id number]  HPI 35 year old Female seen today with swelling, redness and drainage from right eye. No known injury to eye. No fever. No URI symptoms. General health is excellent.  Has had 3 Covid-19 immunizations.Due for CPE. Appt made today Takes OCPs.  Review of Systems no fever chills nausea or vomiting.  History of hyperlipidemia currently on diet.  Has upcoming physical exam scheduled for June and we will follow-up with lipids at that time.     Objective:   Physical Exam  T 97.8, pulse 73, BP 110/80, pulse ox 96% weight 130 pounds. BMI 23.03  Left eye :normal without swelling, redness or drainage.  Right eye: PERLA , EOMs are full. Has swelling lower lid with redness and drianage noted from right eye. Says vision is OK and not blurry. Does not wear contact lenses.    Assessment & Plan:   Acute bacterial conjunctivitis of right eye  Plan: Ocuflox ophthalmic drops 2 drops in right eye 4 times a day for 5 to 7 days.  Apply warm hot compresses for 20 minutes 2-3 times daily.  Call if not improving in 24 to 48 hours or sooner if worse.

## 2020-11-16 NOTE — Telephone Encounter (Signed)
See her this am

## 2020-11-16 NOTE — Telephone Encounter (Signed)
Today Lynn Schneider has called back, she woke up this morning with right eye puffy, watery, crusty and little pain, what should she do.

## 2020-11-16 NOTE — Telephone Encounter (Signed)
Scheduled

## 2020-11-21 ENCOUNTER — Other Ambulatory Visit: Payer: Self-pay | Admitting: Internal Medicine

## 2020-11-29 ENCOUNTER — Other Ambulatory Visit: Payer: BC Managed Care – PPO | Admitting: Internal Medicine

## 2020-11-29 ENCOUNTER — Telehealth: Payer: Self-pay | Admitting: Internal Medicine

## 2020-11-29 DIAGNOSIS — E782 Mixed hyperlipidemia: Secondary | ICD-10-CM

## 2020-11-29 DIAGNOSIS — Z0000 Encounter for general adult medical examination without abnormal findings: Secondary | ICD-10-CM

## 2020-11-29 DIAGNOSIS — Z1329 Encounter for screening for other suspected endocrine disorder: Secondary | ICD-10-CM

## 2020-11-29 NOTE — Telephone Encounter (Signed)
LVM to CB to reschedule lab appointment

## 2020-12-06 ENCOUNTER — Other Ambulatory Visit: Payer: Self-pay

## 2020-12-06 ENCOUNTER — Encounter: Payer: Self-pay | Admitting: Internal Medicine

## 2020-12-06 ENCOUNTER — Ambulatory Visit (INDEPENDENT_AMBULATORY_CARE_PROVIDER_SITE_OTHER): Payer: BC Managed Care – PPO | Admitting: Internal Medicine

## 2020-12-06 VITALS — BP 100/70 | HR 82 | Ht 63.0 in | Wt 148.0 lb

## 2020-12-06 DIAGNOSIS — E782 Mixed hyperlipidemia: Secondary | ICD-10-CM | POA: Diagnosis not present

## 2020-12-06 DIAGNOSIS — Z0000 Encounter for general adult medical examination without abnormal findings: Secondary | ICD-10-CM

## 2020-12-06 DIAGNOSIS — R8290 Unspecified abnormal findings in urine: Secondary | ICD-10-CM

## 2020-12-06 DIAGNOSIS — Z1329 Encounter for screening for other suspected endocrine disorder: Secondary | ICD-10-CM | POA: Diagnosis not present

## 2020-12-06 DIAGNOSIS — Z23 Encounter for immunization: Secondary | ICD-10-CM | POA: Diagnosis not present

## 2020-12-06 LAB — POCT URINALYSIS DIPSTICK
Bilirubin, UA: NEGATIVE
Glucose, UA: NEGATIVE
Ketones, UA: NEGATIVE
Nitrite, UA: NEGATIVE
Protein, UA: POSITIVE — AB
Spec Grav, UA: 1.02 (ref 1.010–1.025)
Urobilinogen, UA: 0.2 E.U./dL
pH, UA: 6 (ref 5.0–8.0)
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
## 2020-12-06 NOTE — Patient Instructions (Signed)
Tetanus immunization update given today and is good for 10 years.  Pap deferred as it was done in 2020.  Labs are drawn and are pending.  Plan to return in 1 year or as needed unless labs have abnormalities that need follow-up sooner.

## 2020-12-07 LAB — URINE CULTURE
MICRO NUMBER:: 11949920
Result:: NO GROWTH
SPECIMEN QUALITY:: ADEQUATE

## 2020-12-07 LAB — COMPLETE METABOLIC PANEL WITH GFR
AG Ratio: 1.7 (calc) (ref 1.0–2.5)
ALT: 23 U/L (ref 6–29)
AST: 14 U/L (ref 10–30)
Albumin: 4.2 g/dL (ref 3.6–5.1)
Alkaline phosphatase (APISO): 50 U/L (ref 31–125)
BUN: 12 mg/dL (ref 7–25)
CO2: 26 mmol/L (ref 20–32)
Calcium: 9.6 mg/dL (ref 8.6–10.2)
Chloride: 104 mmol/L (ref 98–110)
Creat: 0.62 mg/dL (ref 0.50–1.10)
GFR, Est African American: 136 mL/min/{1.73_m2} (ref >=60)
GFR, Est Non African American: 118 mL/min/{1.73_m2} (ref >=60)
Globulin: 2.5 g/dL (calc) (ref 1.9–3.7)
Glucose, Bld: 98 mg/dL (ref 65–139)
Potassium: 4.6 mmol/L (ref 3.5–5.3)
Sodium: 139 mmol/L (ref 135–146)
Total Bilirubin: 0.3 mg/dL (ref 0.2–1.2)
Total Protein: 6.7 g/dL (ref 6.1–8.1)

## 2020-12-07 LAB — CBC WITH DIFFERENTIAL/PLATELET
Absolute Monocytes: 374 cells/uL (ref 200–950)
Basophils Absolute: 40 cells/uL (ref 0–200)
Basophils Relative: 0.4 %
Eosinophils Absolute: 61 cells/uL (ref 15–500)
Eosinophils Relative: 0.6 %
HCT: 41.4 % (ref 35.0–45.0)
Hemoglobin: 14 g/dL (ref 11.7–15.5)
Lymphs Abs: 2374 cells/uL (ref 850–3900)
MCH: 31.3 pg (ref 27.0–33.0)
MCHC: 33.8 g/dL (ref 32.0–36.0)
MCV: 92.4 fL (ref 80.0–100.0)
MPV: 9.1 fL (ref 7.5–12.5)
Monocytes Relative: 3.7 %
Neutro Abs: 7252 cells/uL (ref 1500–7800)
Neutrophils Relative %: 71.8 %
Platelets: 309 10*3/uL (ref 140–400)
RBC: 4.48 10*6/uL (ref 3.80–5.10)
RDW: 11.8 % (ref 11.0–15.0)
Total Lymphocyte: 23.5 %
WBC: 10.1 10*3/uL (ref 3.8–10.8)

## 2020-12-07 LAB — LIPID PANEL
Cholesterol: 207 mg/dL — ABNORMAL HIGH (ref <200)
HDL: 50 mg/dL (ref >=50)
LDL Cholesterol (Calc): 124 mg/dL (calc) — ABNORMAL HIGH
Non-HDL Cholesterol (Calc): 157 mg/dL (calc) — ABNORMAL HIGH (ref <130)
Total CHOL/HDL Ratio: 4.1 (calc) (ref <5.0)
Triglycerides: 212 mg/dL — ABNORMAL HIGH (ref <150)

## 2020-12-07 LAB — TSH: TSH: 1.31 mIU/L

## 2020-12-07 LAB — URINALYSIS, MICROSCOPIC ONLY
Hyaline Cast: NONE SEEN /LPF
WBC, UA: NONE SEEN /HPF (ref 0–5)

## 2021-02-09 DIAGNOSIS — F438 Other reactions to severe stress: Secondary | ICD-10-CM | POA: Diagnosis not present

## 2021-02-11 ENCOUNTER — Other Ambulatory Visit: Payer: Self-pay | Admitting: Internal Medicine

## 2021-03-02 DIAGNOSIS — F438 Other reactions to severe stress: Secondary | ICD-10-CM | POA: Diagnosis not present

## 2021-04-05 ENCOUNTER — Ambulatory Visit: Payer: BC Managed Care – PPO | Admitting: Internal Medicine

## 2021-04-06 DIAGNOSIS — F438 Other reactions to severe stress: Secondary | ICD-10-CM | POA: Diagnosis not present

## 2021-04-07 ENCOUNTER — Ambulatory Visit (INDEPENDENT_AMBULATORY_CARE_PROVIDER_SITE_OTHER): Payer: BC Managed Care – PPO | Admitting: Internal Medicine

## 2021-04-07 ENCOUNTER — Other Ambulatory Visit: Payer: Self-pay

## 2021-04-07 DIAGNOSIS — Z23 Encounter for immunization: Secondary | ICD-10-CM | POA: Diagnosis not present

## 2021-04-07 NOTE — Progress Notes (Signed)
Influenza shot given in Rt deltoid with no issue verbal consent given

## 2021-04-07 NOTE — Patient Instructions (Signed)
Flu vaccine given by CMA 

## 2021-05-10 ENCOUNTER — Other Ambulatory Visit: Payer: Self-pay | Admitting: Internal Medicine

## 2021-11-14 DIAGNOSIS — F4322 Adjustment disorder with anxiety: Secondary | ICD-10-CM | POA: Diagnosis not present

## 2021-11-30 ENCOUNTER — Other Ambulatory Visit: Payer: BC Managed Care – PPO

## 2021-11-30 DIAGNOSIS — R5383 Other fatigue: Secondary | ICD-10-CM

## 2021-11-30 DIAGNOSIS — F32A Depression, unspecified: Secondary | ICD-10-CM

## 2021-11-30 DIAGNOSIS — Z136 Encounter for screening for cardiovascular disorders: Secondary | ICD-10-CM

## 2021-12-01 LAB — CBC WITH DIFFERENTIAL/PLATELET
Absolute Monocytes: 320 cells/uL (ref 200–950)
Basophils Absolute: 28 cells/uL (ref 0–200)
Basophils Relative: 0.4 %
Eosinophils Absolute: 99 cells/uL (ref 15–500)
Eosinophils Relative: 1.4 %
HCT: 41 % (ref 35.0–45.0)
Hemoglobin: 14 g/dL (ref 11.7–15.5)
Lymphs Abs: 2158 cells/uL (ref 850–3900)
MCH: 31.5 pg (ref 27.0–33.0)
MCHC: 34.1 g/dL (ref 32.0–36.0)
MCV: 92.3 fL (ref 80.0–100.0)
MPV: 9.4 fL (ref 7.5–12.5)
Monocytes Relative: 4.5 %
Neutro Abs: 4494 cells/uL (ref 1500–7800)
Neutrophils Relative %: 63.3 %
Platelets: 328 10*3/uL (ref 140–400)
RBC: 4.44 10*6/uL (ref 3.80–5.10)
RDW: 11.7 % (ref 11.0–15.0)
Total Lymphocyte: 30.4 %
WBC: 7.1 10*3/uL (ref 3.8–10.8)

## 2021-12-01 LAB — COMPLETE METABOLIC PANEL WITH GFR
AG Ratio: 1.5 (calc) (ref 1.0–2.5)
ALT: 17 U/L (ref 6–29)
AST: 15 U/L (ref 10–30)
Albumin: 4.1 g/dL (ref 3.6–5.1)
Alkaline phosphatase (APISO): 50 U/L (ref 31–125)
BUN: 14 mg/dL (ref 7–25)
CO2: 24 mmol/L (ref 20–32)
Calcium: 9.4 mg/dL (ref 8.6–10.2)
Chloride: 107 mmol/L (ref 98–110)
Creat: 0.7 mg/dL (ref 0.50–0.97)
Globulin: 2.7 g/dL (calc) (ref 1.9–3.7)
Glucose, Bld: 90 mg/dL (ref 65–99)
Potassium: 4.4 mmol/L (ref 3.5–5.3)
Sodium: 142 mmol/L (ref 135–146)
Total Bilirubin: 0.4 mg/dL (ref 0.2–1.2)
Total Protein: 6.8 g/dL (ref 6.1–8.1)
eGFR: 116 mL/min/{1.73_m2} (ref >=60)

## 2021-12-01 LAB — LIPID PANEL
Cholesterol: 216 mg/dL — ABNORMAL HIGH (ref <200)
HDL: 51 mg/dL (ref >=50)
LDL Cholesterol (Calc): 133 mg/dL (calc) — ABNORMAL HIGH
Non-HDL Cholesterol (Calc): 165 mg/dL (calc) — ABNORMAL HIGH (ref <130)
Total CHOL/HDL Ratio: 4.2 (calc) (ref <5.0)
Triglycerides: 187 mg/dL — ABNORMAL HIGH (ref <150)

## 2021-12-01 LAB — TSH: TSH: 0.88 mIU/L

## 2021-12-07 ENCOUNTER — Encounter: Payer: BC Managed Care – PPO | Admitting: Internal Medicine

## 2021-12-14 ENCOUNTER — Encounter: Payer: BC Managed Care – PPO | Admitting: Internal Medicine

## 2021-12-14 DIAGNOSIS — F4322 Adjustment disorder with anxiety: Secondary | ICD-10-CM | POA: Diagnosis not present

## 2022-01-19 ENCOUNTER — Other Ambulatory Visit: Payer: Self-pay | Admitting: Internal Medicine

## 2022-02-06 ENCOUNTER — Ambulatory Visit (INDEPENDENT_AMBULATORY_CARE_PROVIDER_SITE_OTHER): Payer: BC Managed Care – PPO | Admitting: Internal Medicine

## 2022-02-06 ENCOUNTER — Encounter: Payer: Self-pay | Admitting: Internal Medicine

## 2022-02-06 ENCOUNTER — Other Ambulatory Visit (HOSPITAL_COMMUNITY)
Admission: RE | Admit: 2022-02-06 | Discharge: 2022-02-06 | Disposition: A | Discharge status: Home / Self Care | Payer: BLUE CROSS/BLUE SHIELD | Source: Ambulatory Visit | Attending: Internal Medicine | Admitting: Internal Medicine

## 2022-02-06 VITALS — BP 98/70 | HR 75 | Temp 97.4°F | Ht 63.25 in | Wt 151.4 lb

## 2022-02-06 DIAGNOSIS — R319 Hematuria, unspecified: Secondary | ICD-10-CM | POA: Diagnosis not present

## 2022-02-06 DIAGNOSIS — Z124 Encounter for screening for malignant neoplasm of cervix: Secondary | ICD-10-CM

## 2022-02-06 DIAGNOSIS — E782 Mixed hyperlipidemia: Secondary | ICD-10-CM | POA: Diagnosis not present

## 2022-02-06 DIAGNOSIS — L299 Pruritus, unspecified: Secondary | ICD-10-CM | POA: Diagnosis not present

## 2022-02-06 DIAGNOSIS — R82998 Other abnormal findings in urine: Secondary | ICD-10-CM | POA: Diagnosis not present

## 2022-02-06 DIAGNOSIS — Z0000 Encounter for general adult medical examination without abnormal findings: Secondary | ICD-10-CM

## 2022-02-06 LAB — POCT URINALYSIS DIPSTICK
Bilirubin, UA: NEGATIVE
Glucose, UA: NEGATIVE
Ketones, UA: NEGATIVE
Nitrite, UA: NEGATIVE
Protein, UA: NEGATIVE
Spec Grav, UA: 1.01 (ref 1.010–1.025)
Urobilinogen, UA: 0.2 E.U./dL
pH, UA: 6 (ref 5.0–8.0)

## 2022-02-06 MED ORDER — ROSUVASTATIN CALCIUM 5 MG PO TABS: 5.0000 mg | ORAL_TABLET | Freq: Every day | ORAL | 3 refills | Status: DC

## 2022-02-06 NOTE — Progress Notes (Deleted)
     Annual Wellness Visit     Patient: Lynn Schneider, Female    DOB: 1986/01/27, 36 y.o.   MRN: [id number] Visit Date: 02/06/2022  Chief Complaint  Patient presents with   Annual Exam   Subjective    ANTONETTA CLANTON is a 36 y.o. female who presents today for her Annual Wellness Visit.  HPI   Social History   Social History Narrative   Not on file    Patient Care Team: Baxley, Luanna Cole, MD as PCP - General (Internal Medicine)  Review of Systems   Objective    Vitals: BP 98/70   Pulse 75   Temp (!) 96.4 F (35.8 C) (Tympanic)   Ht 5' 3.25" (1.607 m)   Wt 151 lb 6.4 oz (68.7 kg)   LMP 01/23/2022 (Approximate)   SpO2 98%   BMI 26.61 kg/m   Physical Exam   Most recent functional status assessment:     No data to display         Most recent fall risk assessment:    02/06/2022    2:10 PM  Fall Risk   Falls in the past year? 0  Number falls in past yr: 0  Injury with Fall? 0  Risk for fall due to : No Fall Risks  Follow up Falls evaluation completed    Most recent depression screenings:    02/06/2022    2:10 PM 12/06/2020   10:13 AM  PHQ 2/9 Scores  PHQ - 2 Score 0 0   Most recent cognitive screening:     No data to display             Assessment & Plan     Annual wellness visit done today including the all of the following: Reviewed patient's Family Medical History Reviewed and updated list of patient's medical providers Assessment of cognitive impairment was done Assessed patient's functional ability Established a written schedule for health screening services Health Risk Assessent Completed and Reviewed  Discussed health benefits of physical activity, and encouraged her to engage in regular exercise appropriate for her age and condition.         {provider attestation***:1}   Praveen Coia Philipp Deputy, CMA
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
## 2022-02-07 LAB — URINE CULTURE
MICRO NUMBER:: 13720727
Result:: NO GROWTH
SPECIMEN QUALITY:: ADEQUATE

## 2022-02-12 LAB — CYTOLOGY - PAP
Adequacy: ABSENT
Comment: NEGATIVE
Diagnosis: NEGATIVE
High risk HPV: NEGATIVE

## 2022-02-14 ENCOUNTER — Telehealth: Payer: Self-pay | Admitting: Internal Medicine

## 2022-02-14 DIAGNOSIS — T63441A Toxic effect of venom of bees, accidental (unintentional), initial encounter: Secondary | ICD-10-CM

## 2022-02-14 MED ORDER — TRIAMCINOLONE ACETONIDE 0.1 % EX CREA: 1.0000 | TOPICAL_CREAM | Freq: Three times a day (TID) | CUTANEOUS | 0 refills | Status: DC

## 2022-02-14 NOTE — Telephone Encounter (Signed)
See telephone note and advice about bee sting.

## 2022-02-14 NOTE — Telephone Encounter (Signed)
Patient was informed of message and will pick up Rx from pharmacy.

## 2022-02-14 NOTE — Telephone Encounter (Signed)
Lynn Schneider [phone]  [name] called to say she just got her very first bee sting, is there anything she should do? The site is res, she did get the stinger out, the site is slightly red, stings a little.

## 2022-03-07 ENCOUNTER — Encounter: Payer: Self-pay | Admitting: Internal Medicine

## 2022-03-07 DIAGNOSIS — E782 Mixed hyperlipidemia: Secondary | ICD-10-CM | POA: Insufficient documentation

## 2022-03-07 NOTE — Patient Instructions (Signed)
Begin rosuvastatin 5 mg daily and follow-up here in 3 months.  May apply triamcinolone cream 0.1% 3 times daily as needed for dermatitis.  Had flu vaccine and COVID booster in the fall.

## 2022-04-12 ENCOUNTER — Telehealth: Payer: Self-pay | Admitting: Internal Medicine

## 2022-04-12 MED ORDER — NORGESTIM-ETH ESTRAD TRIPHASIC 0.18/0.215/0.25 MG-35 MCG PO TABS: 1.0000 | ORAL_TABLET | Freq: Every day | ORAL | 3 refills | Status: DC

## 2022-04-12 MED ORDER — NORGESTIM-ETH ESTRAD TRIPHASIC 0.18/0.215/0.25 MG-35 MCG PO TABS: 1.0000 | ORAL_TABLET | Freq: Every day | ORAL | 0 refills | Status: DC

## 2022-04-12 NOTE — Telephone Encounter (Signed)
Received Fax RX request from  [facility] [place], [place] - [address] AT [address] Phone: [phone]  Fax: [phone]      Medication - TRI-SPRINTEC 0.18/0.215/0.25 MG-35 MCG tablet   Last Refill - 01/19/2022  Last OV -  02/06/2022  Last CPE - 02/06/2022  Next Appointment - 05/10/2022

## 2022-04-12 NOTE — Addendum Note (Signed)
Addended by: Geradine Girt D on: 04/12/2022 10:24 AM   Modules accepted: Orders

## 2022-05-08 ENCOUNTER — Other Ambulatory Visit: Payer: BC Managed Care – PPO

## 2022-05-08 DIAGNOSIS — E782 Mixed hyperlipidemia: Secondary | ICD-10-CM

## 2022-05-10 ENCOUNTER — Ambulatory Visit: Payer: BC Managed Care – PPO | Admitting: Internal Medicine

## 2022-05-10 ENCOUNTER — Telehealth: Payer: Self-pay | Admitting: Internal Medicine

## 2022-05-10 ENCOUNTER — Encounter: Payer: Self-pay | Admitting: Internal Medicine

## 2022-05-10 NOTE — Telephone Encounter (Signed)
Missed appt for follow up on hyperlipidemia. Is supposed to be on lipid lowering med low dose 5 mg daily. Had to leave voice mail message regarding compliance with med and needed lab appt. MJB, MD

## 2022-05-11 ENCOUNTER — Encounter: Payer: Self-pay | Admitting: Internal Medicine

## 2022-07-13 ENCOUNTER — Encounter: Payer: Self-pay | Admitting: Internal Medicine

## 2022-07-13 ENCOUNTER — Ambulatory Visit: Payer: BC Managed Care – PPO | Admitting: Internal Medicine

## 2022-07-13 VITALS — BP 116/72 | HR 99 | Temp 98.4°F | Ht 63.25 in | Wt 147.1 lb

## 2022-07-13 DIAGNOSIS — H6693 Otitis media, unspecified, bilateral: Secondary | ICD-10-CM | POA: Diagnosis not present

## 2022-07-13 MED ORDER — AZITHROMYCIN 250 MG PO TABS
ORAL_TABLET | ORAL | 0 refills | Status: AC
Start: 2022-07-13 — End: 2022-07-18

## 2022-07-13 NOTE — Progress Notes (Signed)
   Subjective:    Patient ID: Lynn Schneider, female    DOB: [birth year], 37 y.o.   MRN: [id number]  HPI 37 year old Female with bilateral ear pain. No URI symptoms. Both ears hurt. Nose is a bit stuffy.  Does not recall being around anyone that was ill.  Denies sore throat, fever, chills, myalgias.  Has not had flu vaccine this season.  Her general health is excellent.  She takes no chronic medications except for Crestor 5 mg daily for hyperlipidemia.  She is on oral contraceptives.    Review of Systems see above     Objective:   Physical Exam Temperature 98.4 degrees, pulse 99 regular, blood pressure 116/72, pulse oximetry 99% on room air  BMI 25.86, weight 147 pounds 1.9 ounces.  Skin: Warm and dry.  No cervical adenopathy.  Pharynx is clear.  She has bilateral serous otitis media.  TMs are pink bilaterally but not fiery red.     Assessment & Plan:  Acute bilateral serous otitis media  Plan: Zithromax Z-PAK 2 tabs day 1 followed by 1 tab days 2 through 5.  May take over-the-counter decongestant if needed for ear congestion.  Tylenol if needed for fever or pain.  Call if not better in 5 days or sooner if worse.

## 2022-07-21 NOTE — Patient Instructions (Signed)
It was a pleasure to see you today.  You have an acute bilateral serous otitis media.  Take Zithromax Z-PAK 2 tabs day 1 followed by 1 tab days 2 through 5.  May take over-the-counter decongestant if needed for ear congestion.  Tylenol as needed for fever or pain.  Call if not better in 5 days or sooner if worse.

## 2022-08-10 ENCOUNTER — Telehealth: Payer: Self-pay | Admitting: Internal Medicine

## 2022-08-10 ENCOUNTER — Ambulatory Visit: Payer: BC Managed Care – PPO | Admitting: Internal Medicine

## 2022-08-10 VITALS — BP 112/74 | HR 86 | Temp 98.4°F

## 2022-08-10 DIAGNOSIS — F411 Generalized anxiety disorder: Secondary | ICD-10-CM | POA: Diagnosis not present

## 2022-08-10 MED ORDER — CLONAZEPAM 0.5 MG PO TABS: 0.5000 mg | ORAL_TABLET | Freq: Two times a day (BID) | ORAL | 1 refills | Status: DC | PRN

## 2022-08-10 MED ORDER — SERTRALINE HCL 50 MG PO TABS: 50.0000 mg | ORAL_TABLET | Freq: Every day | ORAL | 5 refills | Status: DC

## 2022-08-10 NOTE — Telephone Encounter (Signed)
scheduled

## 2022-08-10 NOTE — Telephone Encounter (Signed)
Lynn Schneider  [phone]  [name] called to say for the last couple of days she has been having these panic or anxiety attacks, her hands get clammy, legs tingly or like they are falling a sleep, and she feels like she is having a heart attack, There is nothing unusual or out of the ordinary going on or stressing her out that she can thing of. Do you want me to schedule her to come in at 4:30?

## 2022-08-14 DIAGNOSIS — F4322 Adjustment disorder with anxiety: Secondary | ICD-10-CM | POA: Diagnosis not present

## 2022-08-23 DIAGNOSIS — F411 Generalized anxiety disorder: Secondary | ICD-10-CM | POA: Diagnosis not present

## 2022-08-26 ENCOUNTER — Encounter: Payer: Self-pay | Admitting: Internal Medicine

## 2022-08-26 NOTE — Patient Instructions (Signed)
Patient has been started on Zoloft 50 mg daily starting with 1/2 tablet for a few days increasing to a whole tab daily.  May take Klonopin 0.5 mg up to twice daily if needed for acute anxiety.  She will pursue some counseling.  Should not consume alcohol intake Klonopin at the same time.
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
## 2022-08-30 DIAGNOSIS — F411 Generalized anxiety disorder: Secondary | ICD-10-CM | POA: Diagnosis not present

## 2022-09-11 DIAGNOSIS — F411 Generalized anxiety disorder: Secondary | ICD-10-CM | POA: Diagnosis not present

## 2022-09-18 DIAGNOSIS — F411 Generalized anxiety disorder: Secondary | ICD-10-CM | POA: Diagnosis not present

## 2022-10-02 DIAGNOSIS — F411 Generalized anxiety disorder: Secondary | ICD-10-CM | POA: Diagnosis not present

## 2022-10-30 DIAGNOSIS — F411 Generalized anxiety disorder: Secondary | ICD-10-CM | POA: Diagnosis not present

## 2022-11-13 ENCOUNTER — Telehealth: Payer: Self-pay | Admitting: Internal Medicine

## 2022-11-13 NOTE — Telephone Encounter (Signed)
Lynn Schneider [phone]  [name] called to say she needs refill on below medication.   clonazePAM (KLONOPIN) 0.5 MG tablet   [facility] on [address] is new pharmacy

## 2022-11-15 ENCOUNTER — Encounter: Payer: Self-pay | Admitting: Internal Medicine

## 2022-11-15 ENCOUNTER — Ambulatory Visit (INDEPENDENT_AMBULATORY_CARE_PROVIDER_SITE_OTHER): Payer: BC Managed Care – PPO | Admitting: Internal Medicine

## 2022-11-15 ENCOUNTER — Other Ambulatory Visit: Payer: Self-pay

## 2022-11-15 VITALS — BP 110/70 | HR 82 | Temp 98.5°F | Ht 63.25 in | Wt 150.8 lb

## 2022-11-15 DIAGNOSIS — E782 Mixed hyperlipidemia: Secondary | ICD-10-CM

## 2022-11-15 DIAGNOSIS — F419 Anxiety disorder, unspecified: Secondary | ICD-10-CM | POA: Diagnosis not present

## 2022-11-15 MED ORDER — ROSUVASTATIN CALCIUM 5 MG PO TABS: 5.0000 mg | ORAL_TABLET | Freq: Every day | ORAL | 3 refills | Status: DC

## 2022-11-15 NOTE — Progress Notes (Signed)
Patient Care Team: Margaree Mackintosh, MD as PCP - General (Internal Medicine)  Visit Date: 11/15/22  Subjective:    Patient ID: Lynn Schneider , Female   DOB: [birth year], 37 y.o.    MRN: [id number]   37 y.o. Female presents today for recheck on anxiety. Has been prescribed Klonopin and  sertraline  and these helpsymptoms without negative side effects. Occasionally has episodes of anxiety sometime worse in the evenings than during the day. She is not sure why this is the case, She does have a counselor she sees for anxiety. She is having fewer panic attacks on this regimen. She is requesting a refill of clonazepam 0.5 mg, which she usually takes once daily at bedtime. She sees a mental health counselor, Lynn Schneider, regularly.  History of hyperlipidemia treated with rosuvastatin 5 mg daily. TRIG elevated at 187, LDL at 133 in 11/2021. Has upcoming CPE with fasting labs this summer.   Past Medical History:  Diagnosis Date   Anxiety    Depression      Family History  Problem Relation Age of Onset   Heart disease Father    Cancer Neg Hx    Diabetes Maternal Aunt    Hypertension Neg Hx     Social History   Social History Narrative   Not on file      Review of Systems  Constitutional:  Negative for fever and malaise/fatigue.  HENT:  Negative for congestion.   Eyes:  Negative for blurred vision.  Respiratory:  Negative for cough and shortness of breath.   Cardiovascular:  Negative for chest pain, palpitations and leg swelling.  Gastrointestinal:  Negative for vomiting.  Musculoskeletal:  Negative for back pain.  Skin:  Negative for rash.  Neurological:  Negative for loss of consciousness and headaches.        Objective:   Vitals: BP 110/70   Pulse 82   Temp 98.5 F (36.9 C) (Tympanic)   Ht 5' 3.25" (1.607 m)   Wt 150 lb 12.8 oz (68.4 kg)   LMP 11/07/2022   SpO2 98%   BMI 26.50 kg/m    Physical Exam Vitals and nursing note reviewed.  Constitutional:       General: She is not in acute distress.    Appearance: Normal appearance. She is not toxic-appearing.  HENT:     Head: Normocephalic and atraumatic.  Cardiovascular:     Rate and Rhythm: Normal rate and regular rhythm. No extrasystoles are present.    Pulses: Normal pulses.     Heart sounds: Normal heart sounds. No murmur heard.    No friction rub. No gallop.  Pulmonary:     Effort: Pulmonary effort is normal. No respiratory distress.     Breath sounds: Normal breath sounds. No wheezing or rales.  Skin:    General: Skin is warm and dry.  Neurological:     Mental Status: She is alert and oriented to person, place, and time. Mental status is at baseline.  Psychiatric:        Mood and Affect: Mood normal.        Behavior: Behavior normal.        Thought Content: Thought content normal.        Judgment: Judgment normal.       Results:   Studies obtained and personally reviewed by me:   Labs:       Component Value Date/Time   NA 142 11/30/2021 0907   K 4.4 11/30/2021 9528  CL 107 11/30/2021 0907   CO2 24 11/30/2021 0907   GLUCOSE 90 11/30/2021 0907   BUN 14 11/30/2021 0907   CREATININE 0.70 11/30/2021 0907   CALCIUM 9.4 11/30/2021 0907   PROT 6.8 11/30/2021 0907   ALBUMIN 4.0 05/28/2016 1028   AST 15 11/30/2021 0907   ALT 17 11/30/2021 0907   ALKPHOS 52 05/28/2016 1028   BILITOT 0.4 11/30/2021 0907   GFRNONAA 118 12/06/2020 1130   GFRAA 136 12/06/2020 1130     Lab Results  Component Value Date   WBC 7.1 11/30/2021   HGB 14.0 11/30/2021   HCT 41.0 11/30/2021   MCV 92.3 11/30/2021   PLT 328 11/30/2021    Lab Results  Component Value Date   CHOL 216 (H) 11/30/2021   HDL 51 11/30/2021   LDLCALC 133 (H) 11/30/2021   TRIG 187 (H) 11/30/2021   CHOLHDL 4.2 11/30/2021    No results found for: "HGBA1C"   Lab Results  Component Value Date   TSH 0.88 11/30/2021      Assessment & Plan:   Anxiety: stable with clonazepam 0.5 mg twice daily as needed for  anxiety. Seldom needs 2 doses a day. Generally one dose a day, not everyday and is most likely to feel anxious in the evenings. Cautioned not to take with alcoholic beverages.Suggest continued counseling for support.  Hyperlipidemia: treated with rosuvastatin 5 mg daily. TRIG elevated at 187, LDL at 133 in 11/2021. Will recheck lipid panel fasting with upcoming health maintenance exam in August.    I,Alexander Ruley,acting as a scribe for Margaree Mackintosh, MD.,have documented all relevant documentation on the behalf of Margaree Mackintosh, MD,as directed by  Margaree Mackintosh, MD while in the presence of Margaree Mackintosh, MD.   I, Margaree Mackintosh, MD, have reviewed all documentation for this visit. The documentation on 11/15/22 for the exam, diagnosis, procedures, and orders are all accurate and complete.

## 2022-11-15 NOTE — Patient Instructions (Signed)
It was a pleasure to see you today.  Continue current medications as prescribed and continue with her counseling for anxiety.  We will recheck lipid panel at time of your health maintenance exam in August.  Watch diet.  Try to get some exercise.  Do not take Klonopin and drink alcohol at the same time.

## 2022-11-29 DIAGNOSIS — F411 Generalized anxiety disorder: Secondary | ICD-10-CM | POA: Diagnosis not present

## 2022-12-05 ENCOUNTER — Encounter: Payer: Self-pay | Admitting: Internal Medicine

## 2022-12-06 ENCOUNTER — Other Ambulatory Visit: Payer: Self-pay | Admitting: Family

## 2022-12-06 MED ORDER — CLONAZEPAM 0.5 MG PO TABS
0.5000 mg | ORAL_TABLET | Freq: Two times a day (BID) | ORAL | 0 refills | Status: DC | PRN
Start: 2022-12-06 — End: 2023-02-18

## 2022-12-06 MED ORDER — NORGESTIM-ETH ESTRAD TRIPHASIC 0.18/0.215/0.25 MG-35 MCG PO TABS
1.0000 | ORAL_TABLET | Freq: Every day | ORAL | 0 refills | Status: DC
Start: 2022-12-06 — End: 2023-02-18

## 2022-12-06 NOTE — Telephone Encounter (Signed)
Patient needs refill on below medication, she had OV on 11/15/2022 to continue medication  clonazePAM (KLONOPIN) 0.5 MG tablet   [id number] - [place], [place] - [address] AT [facility] OF [address] Phone: [phone]  Fax: [phone]

## 2022-12-25 DIAGNOSIS — F411 Generalized anxiety disorder: Secondary | ICD-10-CM | POA: Diagnosis not present

## 2023-01-16 ENCOUNTER — Other Ambulatory Visit: Payer: Self-pay | Admitting: Internal Medicine

## 2023-01-28 DIAGNOSIS — L649 Androgenic alopecia, unspecified: Secondary | ICD-10-CM | POA: Diagnosis not present

## 2023-01-28 DIAGNOSIS — L648 Other androgenic alopecia: Secondary | ICD-10-CM | POA: Diagnosis not present

## 2023-02-07 DIAGNOSIS — F411 Generalized anxiety disorder: Secondary | ICD-10-CM | POA: Diagnosis not present

## 2023-02-18 ENCOUNTER — Other Ambulatory Visit: Payer: Self-pay | Admitting: Internal Medicine

## 2023-02-18 MED ORDER — SERTRALINE HCL 50 MG PO TABS: 50.0000 mg | ORAL_TABLET | Freq: Every day | ORAL | 0 refills | Status: DC

## 2023-02-18 MED ORDER — CLONAZEPAM 0.5 MG PO TABS: 0.5000 mg | ORAL_TABLET | Freq: Two times a day (BID) | ORAL | 1 refills | Status: DC | PRN

## 2023-02-18 MED ORDER — NORGESTIM-ETH ESTRAD TRIPHASIC 0.18/0.215/0.25 MG-35 MCG PO TABS: 1.0000 | ORAL_TABLET | Freq: Every day | ORAL | 0 refills | Status: DC

## 2023-02-18 NOTE — Telephone Encounter (Signed)
Lynn Schneider [phone]  [name] needs refill on below medications.  Next OV 05/09/2023  clonazePAM (KLONOPIN) 0.5 MG tablet   Norgestimate-Ethinyl Estradiol Triphasic (TRI-SPRINTEC) 0.18/0.215/0.25 MG-35 MCG tablet  sertraline (ZOLOFT) 50 MG tablet   [facility] #[id number] - [place], [place] - [address] AT [facility] OF [address] Phone: [phone]  Fax: [phone]

## 2023-03-14 DIAGNOSIS — F411 Generalized anxiety disorder: Secondary | ICD-10-CM | POA: Diagnosis not present

## 2023-03-28 DIAGNOSIS — F411 Generalized anxiety disorder: Secondary | ICD-10-CM | POA: Diagnosis not present

## 2023-04-15 DIAGNOSIS — F411 Generalized anxiety disorder: Secondary | ICD-10-CM | POA: Diagnosis not present

## 2023-04-25 DIAGNOSIS — F411 Generalized anxiety disorder: Secondary | ICD-10-CM | POA: Diagnosis not present

## 2023-04-26 ENCOUNTER — Other Ambulatory Visit: Payer: Self-pay | Admitting: Internal Medicine

## 2023-04-26 MED ORDER — CLONAZEPAM 0.5 MG PO TABS: 0.5000 mg | ORAL_TABLET | Freq: Two times a day (BID) | ORAL | 0 refills | Status: DC | PRN

## 2023-04-26 MED ORDER — NORGESTIM-ETH ESTRAD TRIPHASIC 0.18/0.215/0.25 MG-35 MCG PO TABS: 1.0000 | ORAL_TABLET | Freq: Every day | ORAL | 0 refills | Status: DC

## 2023-04-26 NOTE — Telephone Encounter (Signed)
She keep same appointments, she did not cancel after I explained how important it was to keep them.

## 2023-04-26 NOTE — Telephone Encounter (Signed)
Lynn Schneider [phone]  [name] called to get refills on below medications, she was going to cancel her CPE for end of month, but I explained how important it was to keep this appointment for her refills. And we did not have any more CPE until next year.  clonazePAM (KLONOPIN) 0.5 MG tablet   Norgestimate-Ethinyl Estradiol Triphasic (TRI-SPRINTEC) 0.18/0.215/0.25 MG-35 MCG tablet   [facility] #[id number] - [place], [place] - [address] AT [facility] OF [address] Phone: [phone]  Fax: [phone]
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
## 2023-05-03 ENCOUNTER — Other Ambulatory Visit: Payer: BC Managed Care – PPO

## 2023-05-03 DIAGNOSIS — E782 Mixed hyperlipidemia: Secondary | ICD-10-CM

## 2023-05-03 DIAGNOSIS — Z0000 Encounter for general adult medical examination without abnormal findings: Secondary | ICD-10-CM

## 2023-05-03 DIAGNOSIS — F419 Anxiety disorder, unspecified: Secondary | ICD-10-CM

## 2023-05-03 DIAGNOSIS — Z1329 Encounter for screening for other suspected endocrine disorder: Secondary | ICD-10-CM

## 2023-05-09 ENCOUNTER — Ambulatory Visit: Payer: BC Managed Care – PPO | Admitting: Internal Medicine

## 2023-05-09 ENCOUNTER — Encounter: Payer: Self-pay | Admitting: Internal Medicine

## 2023-05-09 VITALS — BP 110/80 | HR 96 | Ht 63.25 in | Wt 154.0 lb

## 2023-05-09 DIAGNOSIS — E782 Mixed hyperlipidemia: Secondary | ICD-10-CM | POA: Diagnosis not present

## 2023-05-09 DIAGNOSIS — Z0000 Encounter for general adult medical examination without abnormal findings: Secondary | ICD-10-CM

## 2023-05-09 DIAGNOSIS — Z23 Encounter for immunization: Secondary | ICD-10-CM | POA: Diagnosis not present

## 2023-05-09 DIAGNOSIS — F411 Generalized anxiety disorder: Secondary | ICD-10-CM

## 2023-05-09 NOTE — Patient Instructions (Signed)
Please return in the near future for fasting labs.  Flu vaccine given.  May take over-the-counter decongestant orally if needed for serous otitis media/fluid in ears.  Continue sertraline and Klonopin as previously prescribed.  Continue rosuvastatin 5 mg daily for hyperlipidemia.  Continue oral contraceptives.

## 2023-05-10 ENCOUNTER — Other Ambulatory Visit: Payer: BC Managed Care – PPO

## 2023-05-10 DIAGNOSIS — Z1329 Encounter for screening for other suspected endocrine disorder: Secondary | ICD-10-CM | POA: Diagnosis not present

## 2023-05-10 DIAGNOSIS — Z0000 Encounter for general adult medical examination without abnormal findings: Secondary | ICD-10-CM | POA: Diagnosis not present

## 2023-05-10 DIAGNOSIS — E782 Mixed hyperlipidemia: Secondary | ICD-10-CM

## 2023-05-10 DIAGNOSIS — F419 Anxiety disorder, unspecified: Secondary | ICD-10-CM | POA: Diagnosis not present

## 2023-05-10 DIAGNOSIS — R7309 Other abnormal glucose: Secondary | ICD-10-CM

## 2023-05-13 DIAGNOSIS — D2262 Melanocytic nevi of left upper limb, including shoulder: Secondary | ICD-10-CM | POA: Diagnosis not present

## 2023-05-13 DIAGNOSIS — D224 Melanocytic nevi of scalp and neck: Secondary | ICD-10-CM | POA: Diagnosis not present

## 2023-05-13 DIAGNOSIS — D2261 Melanocytic nevi of right upper limb, including shoulder: Secondary | ICD-10-CM | POA: Diagnosis not present

## 2023-05-13 DIAGNOSIS — D225 Melanocytic nevi of trunk: Secondary | ICD-10-CM | POA: Diagnosis not present

## 2023-05-13 NOTE — Addendum Note (Signed)
Addended by: Gregery Na on: 05/13/2023 05:08 PM   Modules accepted: Orders

## 2023-05-14 ENCOUNTER — Telehealth: Payer: Self-pay

## 2023-05-14 ENCOUNTER — Encounter: Payer: Self-pay | Admitting: Internal Medicine

## 2023-05-14 ENCOUNTER — Ambulatory Visit: Payer: BC Managed Care – PPO | Admitting: Internal Medicine

## 2023-05-14 VITALS — BP 110/60 | HR 69 | Ht 63.25 in | Wt 154.0 lb

## 2023-05-14 DIAGNOSIS — H6693 Otitis media, unspecified, bilateral: Secondary | ICD-10-CM

## 2023-05-14 DIAGNOSIS — H6503 Acute serous otitis media, bilateral: Secondary | ICD-10-CM | POA: Diagnosis not present

## 2023-05-14 MED ORDER — AZITHROMYCIN 250 MG PO TABS: ORAL_TABLET | ORAL | 0 refills | Status: AC

## 2023-05-14 NOTE — Progress Notes (Signed)
Patient Care Team: Margaree Mackintosh, MD as PCP - General (Internal Medicine)  Visit Date: 05/14/23  Subjective:    Patient ID: Lynn Schneider , Female   DOB: 07/21/1985, 37 y.o.    MRN: [id number]   37 y.o. Female presents today for bilateral ear pressure recently. Seen here on 05/09/23 and had bilateral serous otitis media at the time. Has had some sore throat but this is improving. Denies cough.  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 Does not smoke.     Review of Systems  Constitutional:  Negative for fever and malaise/fatigue.  HENT:  Positive for ear pain (Bilateral). Negative for congestion.   Eyes:  Negative for blurred vision.  Respiratory:  Negative for cough and shortness of breath.   Cardiovascular:  Negative for chest pain, palpitations and leg swelling.  Gastrointestinal:  Negative for vomiting.  Musculoskeletal:  Negative for back pain.  Skin:  Negative for rash.  Neurological:  Negative for loss of consciousness and headaches.        Objective:   Vitals: BP 110/60   Pulse 69   Ht 5' 3.25" (1.607 m)   Wt 154 lb (69.9 kg)   LMP 05/08/2023   SpO2 97%   BMI 27.06 kg/m    Physical Exam Vitals and nursing note reviewed.  Constitutional:      General: She is not in acute distress.    Appearance: Normal appearance. She is not toxic-appearing.  HENT:     Head: Normocephalic and atraumatic.     Right Ear: Hearing, ear canal and external ear normal.     Left Ear: Hearing, ear canal and external ear normal.     Ears:     Comments: Right and left TMs full, pink. Neck:     Comments: Small anterior cervical nodes. Pulmonary:     Effort: Pulmonary effort is normal. No respiratory distress.     Breath sounds: No  wheezing or rales.  Skin:    General: Skin is warm and dry.  Neurological:     Mental Status: She is alert and oriented to person, place, and time. Mental status is at baseline.  Psychiatric:        Mood and Affect: Mood normal.        Behavior: Behavior normal.        Thought Content: Thought content normal.        Judgment: Judgment normal.       Results:   Studies obtained and personally reviewed by me:   Labs:       Component Value Date/Time   NA 140 05/10/2023 1002   K 4.2 05/10/2023 1002   CL 103 05/10/2023 1002   CO2 27 05/10/2023 1002   GLUCOSE 114 (H) 05/10/2023 1002   BUN 17 05/10/2023 1002   CREATININE 0.64 05/10/2023 1002   CALCIUM 9.7 05/10/2023 1002   PROT 7.0 05/10/2023 1002   ALBUMIN 4.0 05/28/2016 1028   AST 15 05/10/2023 1002   ALT 17 05/10/2023 1002   ALKPHOS 52 05/28/2016 1028   BILITOT 0.7 05/10/2023 1002   GFRNONAA 118 12/06/2020 1130   GFRAA 136 12/06/2020  1130     Lab Results  Component Value Date   WBC 9.4 05/10/2023   HGB 14.7 05/10/2023   HCT 43.3 05/10/2023   MCV 92.9 05/10/2023   PLT 344 05/10/2023    Lab Results  Component Value Date   CHOL 222 (H) 05/10/2023   HDL 53 05/10/2023   LDLCALC 139 (H) 05/10/2023   TRIG 161 (H) 05/10/2023   CHOLHDL 4.2 05/10/2023    No results found for: "HGBA1C"   Lab Results  Component Value Date   TSH 0.90 05/10/2023      Assessment & Plan:   Persistent bilateral serous otitis media: prescribed Z-Pak two tabs day 1 followed by one tab days 2-5. Get plenty of rest and stay well-hydrated. Contact us if symptoms worsen or do not improve.    I,Alexander Ruley,acting as a Neurosurgeon for Margaree Mackintosh, MD.,have documented all relevant documentation on the behalf of Margaree Mackintosh, MD,as directed by  Margaree Mackintosh, MD while in the presence of Margaree Mackintosh, MD.   I, Margaree Mackintosh, MD, have reviewed all documentation for this visit. The documentation on 05/19/23 for the exam, diagnosis,  procedures, and orders are all accurate and complete.

## 2023-05-14 NOTE — Telephone Encounter (Signed)
Patient called she said when she was here on Thursday for her physical you told her you saw fluid in her ears but her ears were not hurting but today she has pressure in her ears. We made her an appointment at 4:00pm today.

## 2023-05-16 LAB — TEST AUTHORIZATION 2

## 2023-05-16 LAB — COMPLETE METABOLIC PANEL WITH GFR
AG Ratio: 1.5 (calc) (ref 1.0–2.5)
ALT: 17 U/L (ref 6–29)
AST: 15 U/L (ref 10–30)
Albumin: 4.2 g/dL (ref 3.6–5.1)
Alkaline phosphatase (APISO): 81 U/L (ref 31–125)
BUN: 17 mg/dL (ref 7–25)
CO2: 27 mmol/L (ref 20–32)
Calcium: 9.7 mg/dL (ref 8.6–10.2)
Chloride: 103 mmol/L (ref 98–110)
Creat: 0.64 mg/dL (ref 0.50–0.97)
Globulin: 2.8 g/dL (ref 1.9–3.7)
Glucose, Bld: 114 mg/dL — ABNORMAL HIGH (ref 65–99)
Potassium: 4.2 mmol/L (ref 3.5–5.3)
Sodium: 140 mmol/L (ref 135–146)
Total Bilirubin: 0.7 mg/dL (ref 0.2–1.2)
Total Protein: 7 g/dL (ref 6.1–8.1)
eGFR: 117 mL/min/{1.73_m2} (ref >=60)

## 2023-05-16 LAB — CBC WITH DIFFERENTIAL/PLATELET
Absolute Lymphocytes: 1955 {cells}/uL (ref 850–3900)
Absolute Monocytes: 508 {cells}/uL (ref 200–950)
Basophils Absolute: 47 {cells}/uL (ref 0–200)
Basophils Relative: 0.5 %
Eosinophils Absolute: 75 {cells}/uL (ref 15–500)
Eosinophils Relative: 0.8 %
HCT: 43.3 % (ref 35.0–45.0)
Hemoglobin: 14.7 g/dL (ref 11.7–15.5)
MCH: 31.5 pg (ref 27.0–33.0)
MCHC: 33.9 g/dL (ref 32.0–36.0)
MCV: 92.9 fL (ref 80.0–100.0)
MPV: 9.3 fL (ref 7.5–12.5)
Monocytes Relative: 5.4 %
Neutro Abs: 6815 {cells}/uL (ref 1500–7800)
Neutrophils Relative %: 72.5 %
Platelets: 344 10*3/uL (ref 140–400)
RBC: 4.66 10*6/uL (ref 3.80–5.10)
RDW: 11.7 % (ref 11.0–15.0)
Total Lymphocyte: 20.8 %
WBC: 9.4 10*3/uL (ref 3.8–10.8)

## 2023-05-16 LAB — HEMOGLOBIN A1C W/OUT EAG: Hgb A1c MFr Bld: 5.3 %{Hb} (ref <5.7)

## 2023-05-16 LAB — LIPID PANEL
Cholesterol: 222 mg/dL — ABNORMAL HIGH (ref <200)
HDL: 53 mg/dL (ref >=50)
LDL Cholesterol (Calc): 139 mg/dL — ABNORMAL HIGH
Non-HDL Cholesterol (Calc): 169 mg/dL — ABNORMAL HIGH (ref <130)
Total CHOL/HDL Ratio: 4.2 (calc) (ref <5.0)
Triglycerides: 161 mg/dL — ABNORMAL HIGH (ref <150)

## 2023-05-16 LAB — TSH: TSH: 0.9 m[IU]/L

## 2023-05-17 ENCOUNTER — Other Ambulatory Visit: Payer: Self-pay

## 2023-05-17 ENCOUNTER — Other Ambulatory Visit: Payer: Self-pay | Admitting: Internal Medicine

## 2023-05-17 MED ORDER — ROSUVASTATIN CALCIUM 5 MG PO TABS: 5.0000 mg | ORAL_TABLET | Freq: Every day | ORAL | 3 refills | Status: DC

## 2023-05-19 NOTE — Patient Instructions (Signed)
Please take  Zithromax 2 tabs day 1 followed by 1 tab days 2-5. Call if not better in 7-10 days or sooner if worse

## 2023-05-22 ENCOUNTER — Other Ambulatory Visit: Payer: Self-pay | Admitting: Internal Medicine

## 2023-06-11 DIAGNOSIS — F411 Generalized anxiety disorder: Secondary | ICD-10-CM | POA: Diagnosis not present

## 2023-07-01 DIAGNOSIS — F411 Generalized anxiety disorder: Secondary | ICD-10-CM | POA: Diagnosis not present

## 2023-07-22 ENCOUNTER — Telehealth: Payer: Self-pay | Admitting: Internal Medicine

## 2023-07-22 ENCOUNTER — Ambulatory Visit: Payer: BC Managed Care – PPO | Admitting: Internal Medicine

## 2023-07-22 ENCOUNTER — Other Ambulatory Visit: Payer: Self-pay | Admitting: Internal Medicine

## 2023-07-22 MED ORDER — CLONAZEPAM 0.5 MG PO TABS: 0.5000 mg | ORAL_TABLET | Freq: Two times a day (BID) | ORAL | 0 refills | Status: AC | PRN

## 2023-07-22 MED ORDER — NORGESTIM-ETH ESTRAD TRIPHASIC 0.18/0.215/0.25 MG-35 MCG PO TABS: 1.0000 | ORAL_TABLET | Freq: Every day | ORAL | 0 refills | Status: DC

## 2023-07-22 NOTE — Telephone Encounter (Signed)
 Canceled today's appointment and called and lVM for patient to [facility] and make another appointment.

## 2023-07-22 NOTE — Telephone Encounter (Signed)
 Copied from CRM [id number]. Topic: Clinical - Medication Refill >> Jul 22, 2023 12:58 PM Carmell SAUNDERS wrote: Most Recent Primary Care Visit:  Provider: PERRI RONAL PARAS  Department: [facility]  Visit Type: OFFICE VISIT  Date: 05/14/2023  Medication: clonazePAM  (KLONOPIN ) 0.5 MG tablet Norgestimate-Ethinyl Estradiol Triphasic (TRI-SPRINTEC) 0.18/0.215/0.25 MG-35 MCG tablet  Has the patient contacted their pharmacy? Yes (Agent: If no, request that the patient contact the pharmacy for the refill. If patient does not wish to contact the pharmacy document the reason why and proceed with request.) (Agent: If yes, when and what did the pharmacy advise?) Out of refills  Is this the correct pharmacy for this prescription? Yes If no, delete pharmacy and type the correct one.  This is the patient's preferred pharmacy:  [id number] - [place], [place] - [address] AT [facility] OF [address] [place]  [address] Phone: [phone] Fax: [phone]   Has the prescription been filled recently? Yes  Is the patient out of the medication? Yes  Has the patient been seen for an appointment in the last year OR does the patient have an upcoming appointment? Yes  Can we respond through MyChart? Yes  Agent: Please be advised that Rx refills may take up to 3 business days. We ask that you follow-up with your pharmacy.

## 2023-07-22 NOTE — Telephone Encounter (Signed)
 Called and left a voice mail that we have no other appointments today, we only have appointments for tomorrow.

## 2023-07-22 NOTE — Telephone Encounter (Signed)
 Copied from CRM [id number]. Topic: Appointments - Appointment Cancel/Reschedule >> Jul 22, 2023 12:56 PM Carmell SAUNDERS wrote: Patient/patient representative is calling to reschedule today's appointment at 2:30p. Refer to attachments for appointment information. Patient needs to reschedule due to conflict with work schedule. Please contact patient back.

## 2023-07-22 NOTE — Telephone Encounter (Signed)
 LVM to CB trying to see when she is wanting to come in for an appointment.

## 2023-07-30 DIAGNOSIS — F411 Generalized anxiety disorder: Secondary | ICD-10-CM | POA: Diagnosis not present

## 2023-08-13 DIAGNOSIS — Z01419 Encounter for gynecological examination (general) (routine) without abnormal findings: Secondary | ICD-10-CM | POA: Diagnosis not present

## 2023-08-13 DIAGNOSIS — Z1331 Encounter for screening for depression: Secondary | ICD-10-CM | POA: Diagnosis not present

## 2023-08-16 ENCOUNTER — Telehealth: Payer: Self-pay | Admitting: Internal Medicine

## 2023-08-16 ENCOUNTER — Other Ambulatory Visit: Payer: BC Managed Care – PPO

## 2023-08-16 DIAGNOSIS — Z1329 Encounter for screening for other suspected endocrine disorder: Secondary | ICD-10-CM

## 2023-08-16 DIAGNOSIS — Z0000 Encounter for general adult medical examination without abnormal findings: Secondary | ICD-10-CM

## 2023-08-16 DIAGNOSIS — E782 Mixed hyperlipidemia: Secondary | ICD-10-CM

## 2023-08-16 NOTE — Telephone Encounter (Signed)
 LVM to see where she is at, she missed her fasting lab appointment

## 2023-08-16 NOTE — Telephone Encounter (Signed)
 I have also left a voice mail message about the need to have fasting labs. MJB, MD

## 2023-08-19 ENCOUNTER — Other Ambulatory Visit: Payer: Self-pay | Admitting: Internal Medicine

## 2023-08-19 NOTE — Telephone Encounter (Signed)
 Copied from CRM [id number]. Topic: Clinical - Medication Refill >> Aug 19, 2023  5:42 PM Star East wrote: Most Recent Primary Care Visit:  Provider: Sylvan Evener  Department: [facility]  Visit Type: OFFICE VISIT  Date: 05/14/2023  Medication: sertraline  (ZOLOFT ) 50 MG tablet   Has the patient contacted their pharmacy? No (Agent: If no, request that the patient contact the pharmacy for the refill. If patient does not wish to contact the pharmacy document the reason why and proceed with request.) (Agent: If yes, when and what did the pharmacy advise?)  Is this the correct pharmacy for this prescription? Yes If no, delete pharmacy and type the correct one.  This is the patient's preferred pharmacy:  [id number] - [place], [place] - [address] AT [facility] OF [address]  [place] [address] Phone: [phone] Fax: [phone]   Has the prescription been filled recently? Yes  Is the patient out of the medication? Yes  Has the patient been seen for an appointment in the last year OR does the patient have an upcoming appointment? Yes  Can we respond through MyChart? No  Agent: Please be advised that Rx refills may take up to 3 business days. We ask that you follow-up with your pharmacy.

## 2023-08-19 NOTE — Telephone Encounter (Signed)
 Last Fill: 05/17/23  Last OV: 05/14/23 Next OV: 05/15/24  Routing to provider for review/authorization.

## 2023-08-22 ENCOUNTER — Other Ambulatory Visit: Payer: BC Managed Care – PPO

## 2023-08-22 DIAGNOSIS — Z0000 Encounter for general adult medical examination without abnormal findings: Secondary | ICD-10-CM

## 2023-08-22 DIAGNOSIS — Z1329 Encounter for screening for other suspected endocrine disorder: Secondary | ICD-10-CM

## 2023-08-22 DIAGNOSIS — F411 Generalized anxiety disorder: Secondary | ICD-10-CM | POA: Diagnosis not present

## 2023-08-22 DIAGNOSIS — E782 Mixed hyperlipidemia: Secondary | ICD-10-CM

## 2023-08-22 DIAGNOSIS — F419 Anxiety disorder, unspecified: Secondary | ICD-10-CM

## 2023-08-23 LAB — COMPLETE METABOLIC PANEL WITH GFR
AG Ratio: 1.7 (calc) (ref 1.0–2.5)
ALT: 16 U/L (ref 6–29)
AST: 14 U/L (ref 10–30)
Albumin: 4 g/dL (ref 3.6–5.1)
Alkaline phosphatase (APISO): 62 U/L (ref 31–125)
BUN: 14 mg/dL (ref 7–25)
CO2: 20 mmol/L (ref 20–32)
Calcium: 9.2 mg/dL (ref 8.6–10.2)
Chloride: 106 mmol/L (ref 98–110)
Creat: 0.65 mg/dL (ref 0.50–0.97)
Globulin: 2.4 g/dL (ref 1.9–3.7)
Glucose, Bld: 88 mg/dL (ref 65–99)
Potassium: 4.4 mmol/L (ref 3.5–5.3)
Sodium: 140 mmol/L (ref 135–146)
Total Bilirubin: 0.3 mg/dL (ref 0.2–1.2)
Total Protein: 6.4 g/dL (ref 6.1–8.1)
eGFR: 116 mL/min/{1.73_m2} (ref >=60)

## 2023-08-23 LAB — CBC WITH DIFFERENTIAL/PLATELET
Absolute Lymphocytes: 2079 {cells}/uL (ref 850–3900)
Absolute Monocytes: 432 {cells}/uL (ref 200–950)
Basophils Absolute: 54 {cells}/uL (ref 0–200)
Basophils Relative: 0.6 %
Eosinophils Absolute: 90 {cells}/uL (ref 15–500)
Eosinophils Relative: 1 %
HCT: 40.8 % (ref 35.0–45.0)
Hemoglobin: 13.5 g/dL (ref 11.7–15.5)
MCH: 30.5 pg (ref 27.0–33.0)
MCHC: 33.1 g/dL (ref 32.0–36.0)
MCV: 92.3 fL (ref 80.0–100.0)
MPV: 9.4 fL (ref 7.5–12.5)
Monocytes Relative: 4.8 %
Neutro Abs: 6345 {cells}/uL (ref 1500–7800)
Neutrophils Relative %: 70.5 %
Platelets: 314 10*3/uL (ref 140–400)
RBC: 4.42 10*6/uL (ref 3.80–5.10)
RDW: 11.9 % (ref 11.0–15.0)
Total Lymphocyte: 23.1 %
WBC: 9 10*3/uL (ref 3.8–10.8)

## 2023-08-23 LAB — LIPID PANEL
Cholesterol: 199 mg/dL (ref <200)
HDL: 58 mg/dL (ref >=50)
LDL Cholesterol (Calc): 112 mg/dL — ABNORMAL HIGH
Non-HDL Cholesterol (Calc): 141 mg/dL — ABNORMAL HIGH (ref <130)
Total CHOL/HDL Ratio: 3.4 (calc) (ref <5.0)
Triglycerides: 172 mg/dL — ABNORMAL HIGH (ref <150)

## 2023-08-23 LAB — TSH: TSH: 1.15 m[IU]/L

## 2023-08-25 ENCOUNTER — Other Ambulatory Visit: Payer: Self-pay | Admitting: Internal Medicine

## 2023-09-02 DIAGNOSIS — F411 Generalized anxiety disorder: Secondary | ICD-10-CM | POA: Diagnosis not present

## 2023-09-04 DIAGNOSIS — F411 Generalized anxiety disorder: Secondary | ICD-10-CM | POA: Diagnosis not present

## 2023-09-17 DIAGNOSIS — F411 Generalized anxiety disorder: Secondary | ICD-10-CM | POA: Diagnosis not present

## 2023-09-26 ENCOUNTER — Other Ambulatory Visit: Payer: Self-pay | Admitting: Internal Medicine

## 2023-10-16 ENCOUNTER — Other Ambulatory Visit: Payer: Self-pay | Admitting: Internal Medicine

## 2023-10-17 DIAGNOSIS — F411 Generalized anxiety disorder: Secondary | ICD-10-CM | POA: Diagnosis not present

## 2023-10-29 ENCOUNTER — Ambulatory Visit: Payer: BC Managed Care – PPO | Admitting: Physician Assistant

## 2023-11-28 DIAGNOSIS — F411 Generalized anxiety disorder: Secondary | ICD-10-CM | POA: Diagnosis not present

## 2023-12-19 DIAGNOSIS — F411 Generalized anxiety disorder: Secondary | ICD-10-CM | POA: Diagnosis not present

## 2024-01-30 DIAGNOSIS — F411 Generalized anxiety disorder: Secondary | ICD-10-CM | POA: Diagnosis not present

## 2024-03-12 DIAGNOSIS — F411 Generalized anxiety disorder: Secondary | ICD-10-CM | POA: Diagnosis not present

## 2024-05-07 ENCOUNTER — Telehealth: Payer: Self-pay | Admitting: Internal Medicine

## 2024-05-07 NOTE — Telephone Encounter (Signed)
 LVM for PT to confirm which NP appt Patient wants to keep (05/11/24 or 08/11/24)

## 2024-05-08 ENCOUNTER — Telehealth: Payer: Self-pay | Admitting: Internal Medicine

## 2024-05-11 ENCOUNTER — Ambulatory Visit (INDEPENDENT_AMBULATORY_CARE_PROVIDER_SITE_OTHER): Admitting: Physician Assistant

## 2024-05-11 ENCOUNTER — Encounter: Payer: Self-pay | Admitting: Physician Assistant

## 2024-05-11 VITALS — BP 104/68 | HR 89 | Temp 97.5°F | Ht 63.54 in | Wt 168.4 lb

## 2024-05-11 DIAGNOSIS — Z23 Encounter for immunization: Secondary | ICD-10-CM | POA: Diagnosis not present

## 2024-05-11 DIAGNOSIS — F419 Anxiety disorder, unspecified: Secondary | ICD-10-CM

## 2024-05-11 DIAGNOSIS — F32A Depression, unspecified: Secondary | ICD-10-CM

## 2024-05-11 NOTE — Progress Notes (Signed)
 Patient ID: Lynn Schneider, female    DOB: [birth year], 38 y.o.   MRN: [id number]   Assessment & Plan:  Anxiety and depression  Need for hepatitis vaccination  Immunization due -     Flu vaccine trivalent PF, 6mos and older(Flulaval,Afluria,Fluarix,Fluzone) -     HPV 9-valent vaccine,Recombinat     Assessment & Plan General Health Maintenance She is establishing care and is up to date with Pap smears. She is not sexually active but is open to HPV vaccination, which is recommended up to age 54 to protect against high-risk strains causing cervical cancer. She is also receiving a flu shot today. She exercises regularly through walking and Pilates. - Administered HPV vaccine series (0, 2, and 6 months intervals) - Administered flu shot - Schedule annual physical in March or April - Encouraged regular exercise and sun protection  Anxiety and depression - [facility], stable on Zoloft  and klonopin , regular f/up with counselor as needed  Birth control managed by her GYN Dr. Kandyce      Return in about 4 months (around 09/08/2024) for physical, fasting labs .    Subjective:    Chief Complaint  Patient presents with   New Patient (Initial Visit)    New patient establishing new care with PCP. Last Pap was 2023, results Negative for intraepithelial lesion or malignancy (NILM) COMMENT (MOLECULAR): Normal Reference Range HPV - Negative     HPI Discussed the use of AI scribe software for clinical note transcription with the patient, who gave verbal consent to proceed.  History of Present Illness Lynn Schneider is a 38 year old female who presents for establishment of care and routine health maintenance.  She is currently managing anxiety and depression with Zoloft  and Klonopin , prescribed by Lauraine Jefferson at [facility]. She also attends counseling sessions with Randine Collet at [facility] as needed.  She is up to date with her Pap smears and  receives birth control from Dr. Babetta at [facility]. She is not sexually active and is considering the HPV vaccine, which is available up to age 64.  Her family history includes arthritis in her mother, who is still living. Her father passed away when she was 29 years old, but the cause is unknown. There is no known family history of cancer, kidney disease, or heart disease.  She maintains physical activity by walking her dog and participating in Pilates at a local studio. 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  [facility] for female care - Dr. Kandyce    Past Medical History:  Diagnosis Date   Anxiety    Depression     Past Surgical History:  Procedure Laterality Date   NO PAST SURGERIES      Family History  Problem Relation Age of Onset   Arthritis Mother    Heart disease Father    Diabetes Maternal Aunt    Colon cancer Neg Hx    Hypertension Neg Hx    Breast cancer Neg Hx     Social History   Tobacco Use   Smoking status: Never   Smokeless tobacco: Never  Vaping Use   Vaping status: Never Used  Substance Use Topics   Alcohol use: Not Currently    Alcohol/week: 1.0 standard drink of alcohol    Comment: sometimes mixed drinks   Drug use: No     No Known Allergies  Review of Systems NEGATIVE UNLESS  OTHERWISE INDICATED IN HPI      Objective:     BP 104/68   Pulse 89   Temp (!) 97.5 F (36.4 C) (Temporal)   Ht 5' 3.54 (1.614 m)   Wt 168 lb 6.4 oz (76.4 kg)   LMP 05/05/2024 (Exact Date)   SpO2 97%   BMI 29.32 kg/m   Wt Readings from Last 3 Encounters:  05/11/24 168 lb 6.4 oz (76.4 kg)  05/14/23 154 lb (69.9 kg)  05/09/23 154 lb (69.9 kg)    BP Readings from Last 3 Encounters:  05/11/24 104/68  05/14/23 110/60  05/09/23 110/80     Physical Exam Vitals and nursing note reviewed.  Constitutional:      Appearance: Normal appearance. She is normal weight. She is not toxic-appearing.   HENT:     Head: Normocephalic and atraumatic.     Right Ear: External ear normal.     Left Ear: External ear normal.  Eyes:     Extraocular Movements: Extraocular movements intact.     Conjunctiva/sclera: Conjunctivae normal.     Pupils: Pupils are equal, round, and reactive to light.  Cardiovascular:     Rate and Rhythm: Normal rate and regular rhythm.     Pulses: Normal pulses.     Heart sounds: Normal heart sounds.  Pulmonary:     Effort: Pulmonary effort is normal.     Breath sounds: Normal breath sounds.  Musculoskeletal:        General: Normal range of motion.     Cervical back: Normal range of motion and neck supple.  Skin:    General: Skin is warm and dry.  Neurological:     General: No focal deficit present.     Mental Status: She is alert and oriented to person, place, and time.  Psychiatric:        Mood and Affect: Mood normal.        Behavior: Behavior normal.        Thought Content: Thought content normal.        Judgment: Judgment normal.             Zoriyah Scheidegger M Maeci Kalbfleisch, PA-C

## 2024-05-11 NOTE — Patient Instructions (Signed)
 Welcome to [facility]! It was a pleasure meeting you today.   PLEASE NOTE:  If you had any LAB tests please let us know if you have not heard back within a few days. You may see your results on MyChart before we have a chance to review them but we will give you a call once they are reviewed by [place]. If we ordered any REFERRALS today, please let us know if you have not heard from their office within the next two weeks. Let us know through MyChart if you are needing REFILLS, or have your pharmacy send [place] the request. You can also use MyChart to communicate with me or any office staff.  Please try these tips to maintain a healthy lifestyle:  Eat most of your calories during the day when you are active. Eliminate processed foods including packaged sweets (pies, cakes, cookies), reduce intake of potatoes, white bread, white pasta, and white rice. Look for whole grain options, oat flour or almond flour.  Each meal should contain half fruits/vegetables, one quarter protein, and one quarter carbs (no bigger than a computer mouse).  Cut down on sweet beverages. This includes juice, soda, and sweet tea. Also watch fruit intake, though this is a healthier sweet option, it still contains natural sugar! Limit to 3 servings daily.  Drink at least 1 glass of water with each meal and aim for at least 8 glasses (64 ounces) per day.  Exercise at least 150 minutes every week to the best of your ability.    Take Care,  Audel Coakley, PA-C

## 2024-05-12 ENCOUNTER — Other Ambulatory Visit: Payer: BC Managed Care – PPO

## 2024-05-12 DIAGNOSIS — R7309 Other abnormal glucose: Secondary | ICD-10-CM

## 2024-05-12 DIAGNOSIS — E782 Mixed hyperlipidemia: Secondary | ICD-10-CM

## 2024-05-12 DIAGNOSIS — Z1329 Encounter for screening for other suspected endocrine disorder: Secondary | ICD-10-CM

## 2024-05-12 DIAGNOSIS — Z0000 Encounter for general adult medical examination without abnormal findings: Secondary | ICD-10-CM

## 2024-05-13 ENCOUNTER — Encounter: Payer: Self-pay | Admitting: Physician Assistant

## 2024-05-14 NOTE — Telephone Encounter (Signed)
 LVM to schedule ov with any available provider

## 2024-05-15 ENCOUNTER — Encounter: Payer: BC Managed Care – PPO | Admitting: Internal Medicine

## 2024-05-15 ENCOUNTER — Encounter: Payer: Self-pay | Admitting: Physician Assistant

## 2024-05-15 ENCOUNTER — Ambulatory Visit: Admitting: Physician Assistant

## 2024-05-15 VITALS — BP 104/76 | HR 79 | Temp 97.5°F | Ht 63.54 in | Wt 168.6 lb

## 2024-05-15 DIAGNOSIS — J069 Acute upper respiratory infection, unspecified: Secondary | ICD-10-CM

## 2024-05-15 MED ORDER — BENZONATATE 100 MG PO CAPS: 100.0000 mg | ORAL_CAPSULE | Freq: Three times a day (TID) | ORAL | 0 refills | Status: DC | PRN

## 2024-05-15 NOTE — Progress Notes (Signed)
 Patient ID: Lynn Schneider, female    DOB: 09-19-1985, 38 y.o.   MRN: [id number]   Assessment & Plan:  Acute URI  Other orders -     Benzonatate ; Take 1 capsule (100 mg total) by mouth 3 (three) times daily as needed for cough.  Dispense: 20 capsule; Refill: 0      Assessment and Plan Assessment & Plan Acute upper respiratory infection (viral etiology suspected) Symptoms include throat irritation, ear pressure, and a dry cough with occasional sputum production. No high fever or significant systemic symptoms. Likely viral etiology, possibly related to recent influenza vaccination. No indication for Paxlovid due to low risk and cost considerations. - Encouraged hydration and use of tea with honey. - Prescribed cough tablets for symptomatic relief. - Advised to monitor symptoms and report if condition worsens or does not improve by next Tuesday or Wednesday.  Minor reaction to influenza vaccine (suspected) Symptoms may be a minor reaction to the influenza vaccine received on November 3rd, 2025. No severe symptoms reported. - Continue to monitor for any worsening of symptoms.    F/up prn     Subjective:    Chief Complaint  Patient presents with   Sore Throat   Ear Pain    Started the day after she got her flu injection here on 11/3. No fever.     HPI Discussed the use of AI scribe software for clinical note transcription with the patient, who gave verbal consent to proceed.  History of Present Illness Lynn Schneider is a 38 year old female who presents with sore throat and ear pressure.  Symptoms began a couple of days ago, primarily involving the throat and ears. The throat feels slightly better now but still sounds irritated. There is pressure in the ears and occasional irritation, but no specific symptom is currently prominent.  No recent exposure to anyone sick. No high temperatures or fevers have been noted. She denies feeling achy or experiencing flu-like or  COVID-like symptoms.  She received her flu vaccine on May 11, 2024, four days ago, and has been taking Mucinex Cold and Flu, which has provided some relief.  No high fever or significant body aches. She occasionally brings up phlegm when coughing, but it is mostly a dry cough.     Past Medical History:  Diagnosis Date   Anxiety    Depression     Past Surgical History:  Procedure Laterality Date   NO PAST SURGERIES      Family History  Problem Relation Age of Onset   Arthritis Mother    Heart disease Father    Diabetes Maternal Aunt    Colon cancer Neg Hx    Hypertension Neg Hx    Breast cancer Neg Hx     Social History   Tobacco Use   Smoking status: Never   Smokeless tobacco: Never  Vaping Use   Vaping status: Never Used  Substance Use Topics   Alcohol use: Not Currently    Alcohol/week: 1.0 standard drink of alcohol    Comment: sometimes mixed drinks   Drug use: No     No Known Allergies  Review of Systems NEGATIVE UNLESS OTHERWISE INDICATED IN HPI      Objective:     BP 104/76   Pulse 79   Temp (!) 97.5 F (36.4 C) (Temporal)   Ht 5' 3.54 (1.614 m)   Wt 168 lb 9.6 oz (76.5 kg)   LMP 05/05/2024 (Exact Date)  SpO2 97%   BMI 29.36 kg/m   Wt Readings from Last 3 Encounters:  05/15/24 168 lb 9.6 oz (76.5 kg)  05/11/24 168 lb 6.4 oz (76.4 kg)  05/14/23 154 lb (69.9 kg)    BP Readings from Last 3 Encounters:  05/15/24 104/76  05/11/24 104/68  05/14/23 110/60     Physical Exam Vitals and nursing note reviewed.  Constitutional:      General: She is not in acute distress.    Appearance: Normal appearance. She is not ill-appearing.  HENT:     Head: Normocephalic.     Right Ear: Tympanic membrane, ear canal and external ear normal.     Left Ear: Tympanic membrane, ear canal and external ear normal.     Nose: No congestion or rhinorrhea.     Mouth/Throat:     Mouth: Mucous membranes are moist.     Pharynx: No oropharyngeal exudate  or posterior oropharyngeal erythema.  Eyes:     Extraocular Movements: Extraocular movements intact.     Conjunctiva/sclera: Conjunctivae normal.     Pupils: Pupils are equal, round, and reactive to light.  Cardiovascular:     Rate and Rhythm: Normal rate and regular rhythm.     Pulses: Normal pulses.     Heart sounds: Normal heart sounds. No murmur heard. Pulmonary:     Effort: Pulmonary effort is normal. No respiratory distress.     Breath sounds: Normal breath sounds. No wheezing.  Musculoskeletal:     Cervical back: Normal range of motion.  Skin:    General: Skin is warm.  Neurological:     Mental Status: She is alert and oriented to person, place, and time.  Psychiatric:        Mood and Affect: Mood normal.        Behavior: Behavior normal.             Hikeem Andersson M Doil Kamara, PA-C

## 2024-05-18 ENCOUNTER — Encounter: Payer: Self-pay | Admitting: Physician Assistant

## 2024-05-18 NOTE — Patient Instructions (Signed)
You have a viral upper respiratory infection. This type of infection does not require antibiotics. Symptoms should improve over the next 7 to 10 days.   Some things that can make you feel better are: - Increased rest - Increasing fluid with water/sugar free electrolytes - Acetaminophen and ibuprofen as needed for fever/pain - Salt water gargling, chloraseptic spray and throat lozenges for sore throat - OTC guaifenesin (Mucinex) 600 mg twice daily for congestion - Saline sinus flushes or a neti pot - Humidifying the air

## 2024-05-18 NOTE — Telephone Encounter (Signed)
 Please see pt update and advise if pt needs to come in for follow up

## 2024-05-19 ENCOUNTER — Other Ambulatory Visit: Payer: Self-pay | Admitting: Physician Assistant

## 2024-05-19 MED ORDER — AZITHROMYCIN 250 MG PO TABS: ORAL_TABLET | ORAL | 0 refills | Status: AC

## 2024-05-29 ENCOUNTER — Encounter: Payer: Self-pay | Admitting: Internal Medicine

## 2024-06-22 NOTE — Telephone Encounter (Signed)
 done

## 2024-07-07 ENCOUNTER — Ambulatory Visit: Payer: Self-pay

## 2024-07-07 ENCOUNTER — Encounter: Payer: Self-pay | Admitting: Physician Assistant

## 2024-07-07 NOTE — Telephone Encounter (Signed)
 FYI Only or Action Required?: Action required by provider: pt was told medications were being sent in for her. Please call pt to discuss.  Patient was last seen in primary care on 05/15/2024 by Allwardt, Mardy HERO, PA-C.  Called Nurse Triage reporting Cough.  Symptoms began several days ago.  Interventions attempted: Rest, hydration, or home remedies.  Symptoms are: unchanged.  Triage Disposition: See Physician Within 24 Hours  Patient/caregiver understands and will follow disposition?: Yes

## 2024-07-07 NOTE — Telephone Encounter (Signed)
FYI on patient.  

## 2024-07-07 NOTE — Telephone Encounter (Signed)
" °  Reason for Disposition  Earache is present  Answer Assessment - Initial Assessment Questions Pt states feels similar to when she was sick last time. She states she was given a zpack and tesslon pearls. She said she got a message from someone saying medication was sent in for her. She states she was around her parents and both have something respiratory. She states she does have bilateral pressure in her ears. Rn advised for her to keep her UC appt scheduled for tomorrow as there is nothing on the schedule available. Pt stated understanding. Rn advised would send the message to see if someone in the office advised meds were sent in. She states if meds are sent in for her please call or mychart message so that she knows if she needs to keep UC appt for tomorrow.    1. ONSET: When did the cough begin?      A couple of days 2. SEVERITY: How bad is the cough today?       3. SPUTUM: Describe the color of your sputum (e.g., none, dry cough; clear, white, yellow, green)     denies 4. HEMOPTYSIS: Are you coughing up any blood? If Yes, ask: How much? (e.g., flecks, streaks, tablespoons, etc.)     denies 5. DIFFICULTY BREATHING: Are you having difficulty breathing? If Yes, ask: How bad is it? (e.g., mild, moderate, severe)      denies 6. FEVER: Do you have a fever? If Yes, ask: What is your temperature, how was it measured, and when did it start?     denies 7. CARDIAC HISTORY: Do you have any history of heart disease? (e.g., heart attack, congestive heart failure)      no 8. LUNG HISTORY: Do you have any history of lung disease?  (e.g., pulmonary embolus, asthma, emphysema)     no 9. OTHER SYMPTOMS: Do you have any other symptoms? (e.g., runny nose, wheezing, chest pain)       Bilateral ear pressure  Protocols used: Cough - Acute Non-Productive-A-AH  "

## 2024-07-07 NOTE — Telephone Encounter (Signed)
 Called pt and advised per MyChart message this morning, patient will need appt in office and go to UC for evaluation. Pt verbalized understanding and scheduled for acute visit with [facility] 07/08/24.

## 2024-07-07 NOTE — Telephone Encounter (Signed)
 First attempt, left message   Message from Vena HERO sent at 07/07/2024 11:06 AM EST  Reason for Triage: cough and throat irritation starting. Mother is already sick and symptoms started for pt yesterday

## 2024-07-08 ENCOUNTER — Ambulatory Visit (HOSPITAL_COMMUNITY): Payer: Self-pay

## 2024-07-08 ENCOUNTER — Encounter: Payer: Self-pay | Admitting: Family Medicine

## 2024-07-08 ENCOUNTER — Ambulatory Visit: Admitting: Family Medicine

## 2024-07-08 VITALS — BP 102/78 | HR 133 | Temp 98.4°F | Ht 63.54 in | Wt 165.2 lb

## 2024-07-08 DIAGNOSIS — J020 Streptococcal pharyngitis: Secondary | ICD-10-CM | POA: Diagnosis not present

## 2024-07-08 DIAGNOSIS — R059 Cough, unspecified: Secondary | ICD-10-CM

## 2024-07-08 DIAGNOSIS — J029 Acute pharyngitis, unspecified: Secondary | ICD-10-CM | POA: Diagnosis not present

## 2024-07-08 LAB — POCT RAPID STREP A (OFFICE): Rapid Strep A Screen: POSITIVE — AB

## 2024-07-08 LAB — POCT INFLUENZA A/B
Influenza A, POC: NEGATIVE
Influenza B, POC: NEGATIVE

## 2024-07-08 LAB — POC COVID19 BINAXNOW: SARS Coronavirus 2 Ag: NEGATIVE

## 2024-07-08 MED ORDER — AZITHROMYCIN 250 MG PO TABS: ORAL_TABLET | ORAL | 0 refills | Status: AC

## 2024-07-08 MED ORDER — BENZONATATE 100 MG PO CAPS: 100.0000 mg | ORAL_CAPSULE | Freq: Two times a day (BID) | ORAL | 0 refills | Status: AC | PRN

## 2024-07-08 NOTE — Progress Notes (Signed)
 "  Subjective:     Patient ID: Lynn Schneider, female    DOB: [birth year], 38 y.o.   MRN: [id number]  Chief Complaint  Patient presents with   Cough   Sore Throat   Ear Pain    Symptoms started 2 days ago. No fever. Has tried OTC meds that seemed to have helped. No NVD.     Discussed the use of AI scribe software for clinical note transcription with the patient, who gave verbal consent to proceed.  History of Present Illness Lynn Schneider is a 38 year old female who presents with a cough, sore throat, and ear pain for two days.  She has been experiencing a cough and sore throat for the past two days. There is no runny nose or congestion, but she notes ear pain, with the right ear being more painful than the left, and a sensation of pressure in both ears.  She denies having a fever but mentions experiencing body aches that resolved quickly. No vomiting or diarrhea has occurred. She took a dose of liquid Mucinex last night to help with her symptoms.  Her mother, who she does not live with, is also sick with similar symptoms, and her father, who was staying with her, had something similar.  During the review of symptoms, she denies bringing up any sputum with her cough and has not experienced chills. She also denies any history of asthma.    Health Maintenance Due  Topic Date Due   COVID-19 Vaccine (6 - 2025-26 season) 03/09/2024   HPV VACCINES (2 - 3-dose SCDM series) 06/08/2024    Past Medical History:  Diagnosis Date   Anxiety    Depression     Past Surgical History:  Procedure Laterality Date   NO PAST SURGERIES      Current Medications[1]  Allergies[2] ROS neg/noncontributory except as noted HPI/below      Objective:     BP 102/78 (BP Location: Left Arm, Patient Position: Sitting, Cuff Size: Normal)   Pulse (!) 133   Temp 98.4 F (36.9 C) (Temporal)   Ht 5' 3.54 (1.614 m)   Wt 165 lb 3.2 oz (74.9 kg)   LMP 06/05/2024   SpO2 91%   BMI 28.77 kg/m   Wt Readings from Last 3 Encounters:  07/08/24 165 lb 3.2 oz (74.9 kg)  05/15/24 168 lb 9.6 oz (76.5 kg)  05/11/24 168 lb 6.4 oz (76.4 kg)    Physical Exam VITALS: SaO2- 91%-some pink nailpolish but not dark GENERAL: Well developed, well nourished, no acute distress. HEAD EYES EARS NOSE THROAT: Normocephalic, atraumatic, conjunctiva not injected, sclera nonicteric. Clear fluid behind right ear, left ear normal. Throat normal. CARDIAC: tachy rate and rhythm, S1 S2 present, no murmur, NECK: Supple, no thyromegaly, small lymph nodes palpable. LUNGS: Clear to auscultation bilaterally, no wheezes. EXTREMITIES: No edema. MUSCULOSKELETAL: No gross abnormalities. NEUROLOGICAL: Alert and oriented x3, cranial nerves II through XII intact. PSYCHIATRIC: Normal mood, good eye contact.  Results for orders placed or performed in visit on 07/08/24  POC COVID-19 BinaxNow   Collection Time: 07/08/24  9:26 AM  Result Value Ref Range   SARS Coronavirus 2 Ag Negative Negative  POCT Influenza A/B   Collection Time: 07/08/24  9:26 AM  Result Value Ref Range   Influenza A, POC Negative Negative   Influenza B, POC Negative Negative  POCT rapid strep A   Collection Time: 07/08/24  9:26 AM  Result Value Ref Range   Rapid Strep A  Screen Positive (A) Negative         Assessment & Plan:  Strep pharyngitis  Cough, unspecified type -     POC COVID-19 BinaxNow -     POCT Influenza A/B -     POCT rapid strep A  Sore throat -     POC COVID-19 BinaxNow -     POCT Influenza A/B -     POCT rapid strep A  Other orders -     Azithromycin ; Take 2 tablets on day 1, then 1 tablet daily on days 2 through 5  Dispense: 6 tablet; Refill: 0 -     Benzonatate ; Take 1 capsule (100 mg total) by mouth 2 (two) times daily as needed for cough.  Dispense: 20 capsule; Refill: 0    Assessment and Plan Assessment & Plan Acute upper respiratory infection   She has experienced cough, sore throat, and ear pain for two  days without fever, runny nose, or congestion. Tests for flu, strep, and COVID are negative. Oxygen saturation is 91% with an elevated pulse, but lung sounds are clear. Small lymph nodes are present, and the right ear shows clear fluid and pressure. A viral infection is likely due to negative tests and similar symptoms in family members. Start Z-Pak antibiotic. Advise drinking plenty of fluids and avoiding caffeine. Prescribe Tessalon  Perles for cough. Advise going to the ER if symptoms worsen or if dizziness or lightheadedness occurs.  Strep throat-very faint.  Zpk should cover and lungs     Return if symptoms worsen or fail to improve.  Jenkins CHRISTELLA Carrel, MD     [1]  Current Outpatient Medications:    azithromycin  (ZITHROMAX ) 250 MG tablet, Take 2 tablets on day 1, then 1 tablet daily on days 2 through 5, Disp: 6 tablet, Rfl: 0   benzonatate  (TESSALON ) 100 MG capsule, Take 1 capsule (100 mg total) by mouth 2 (two) times daily as needed for cough., Disp: 20 capsule, Rfl: 0   clonazePAM  (KLONOPIN ) 0.5 MG tablet, Take 1 tablet (0.5 mg total) by mouth 2 (two) times daily as needed for anxiety., Disp: 60 tablet, Rfl: 0   Norgestimate-Ethinyl Estradiol Triphasic (TRI-ESTARYLLA) 0.18/0.215/0.25 MG-35 MCG tablet, TAKE 1 TABLET BY MOUTH DAILY AS DIRECTED, Disp: 84 tablet, Rfl: 5   sertraline  (ZOLOFT ) 50 MG tablet, TAKE 1 TABLET(50 MG) BY MOUTH DAILY, Disp: 30 tablet, Rfl: 1 [2] No Known Allergies  "

## 2024-07-08 NOTE — Patient Instructions (Signed)
 Drink plenty of fluids  Rest  Sent in zpack  Worse, etc. ER

## 2024-08-11 ENCOUNTER — Ambulatory Visit: Admitting: Physician Assistant

## 2024-09-08 ENCOUNTER — Encounter: Admitting: Physician Assistant
